# Patient Record
Sex: Female | Born: 1965 | Race: White | Hispanic: No | Marital: Married | Smoking: Never smoker
Health system: Southern US, Community
[De-identification: ages and names within clinical notes are randomized; demographics above are authoritative.]

## PROBLEM LIST (undated history)

## (undated) DIAGNOSIS — D649 Anemia, unspecified: Secondary | ICD-10-CM

## (undated) DIAGNOSIS — M1990 Unspecified osteoarthritis, unspecified site: Secondary | ICD-10-CM

## (undated) DIAGNOSIS — Z87442 Personal history of urinary calculi: Secondary | ICD-10-CM

## (undated) DIAGNOSIS — M5440 Lumbago with sciatica, unspecified side: Secondary | ICD-10-CM

## (undated) DIAGNOSIS — F419 Anxiety disorder, unspecified: Secondary | ICD-10-CM

## (undated) DIAGNOSIS — M5126 Other intervertebral disc displacement, lumbar region: Secondary | ICD-10-CM

---

## 1998-12-02 ENCOUNTER — Other Ambulatory Visit: Admission: RE | Admit: 1998-12-02 | Discharge: 1998-12-02 | Payer: Self-pay | Admitting: *Deleted

## 1999-12-03 ENCOUNTER — Other Ambulatory Visit: Admission: RE | Admit: 1999-12-03 | Discharge: 1999-12-03 | Payer: Self-pay | Admitting: *Deleted

## 2000-10-06 ENCOUNTER — Encounter: Payer: Self-pay | Admitting: *Deleted

## 2000-10-06 ENCOUNTER — Encounter: Admission: RE | Admit: 2000-10-06 | Discharge: 2000-10-06 | Payer: Self-pay | Admitting: *Deleted

## 2000-12-14 ENCOUNTER — Other Ambulatory Visit: Admission: RE | Admit: 2000-12-14 | Discharge: 2000-12-14 | Payer: Self-pay | Admitting: *Deleted

## 2002-01-03 ENCOUNTER — Other Ambulatory Visit: Admission: RE | Admit: 2002-01-03 | Discharge: 2002-01-03 | Payer: Self-pay | Admitting: *Deleted

## 2002-07-13 ENCOUNTER — Encounter: Payer: Self-pay | Admitting: Family Medicine

## 2002-07-13 ENCOUNTER — Encounter: Admission: RE | Admit: 2002-07-13 | Discharge: 2002-07-13 | Payer: Self-pay | Admitting: Family Medicine

## 2003-01-07 ENCOUNTER — Other Ambulatory Visit: Admission: RE | Admit: 2003-01-07 | Discharge: 2003-01-07 | Payer: Self-pay | Admitting: Obstetrics and Gynecology

## 2004-01-14 ENCOUNTER — Other Ambulatory Visit: Admission: RE | Admit: 2004-01-14 | Discharge: 2004-01-14 | Payer: Self-pay | Admitting: *Deleted

## 2005-04-16 ENCOUNTER — Encounter (INDEPENDENT_AMBULATORY_CARE_PROVIDER_SITE_OTHER): Payer: Self-pay | Admitting: Specialist

## 2005-04-16 ENCOUNTER — Ambulatory Visit (HOSPITAL_COMMUNITY): Admission: RE | Admit: 2005-04-16 | Discharge: 2005-04-16 | Payer: Self-pay | Admitting: Obstetrics & Gynecology

## 2007-03-23 ENCOUNTER — Encounter: Admission: RE | Admit: 2007-03-23 | Discharge: 2007-03-23 | Payer: Self-pay | Admitting: Obstetrics and Gynecology

## 2008-04-02 ENCOUNTER — Other Ambulatory Visit: Admission: RE | Admit: 2008-04-02 | Discharge: 2008-04-02 | Payer: Self-pay | Admitting: Family Medicine

## 2009-02-05 ENCOUNTER — Ambulatory Visit (HOSPITAL_COMMUNITY): Admission: RE | Admit: 2009-02-05 | Discharge: 2009-02-05 | Payer: Self-pay | Admitting: Family Medicine

## 2009-04-23 ENCOUNTER — Other Ambulatory Visit: Admission: RE | Admit: 2009-04-23 | Discharge: 2009-04-23 | Payer: Self-pay | Admitting: Family Medicine

## 2010-04-24 ENCOUNTER — Other Ambulatory Visit: Admission: RE | Admit: 2010-04-24 | Discharge: 2010-04-24 | Payer: Self-pay | Admitting: Family Medicine

## 2011-04-09 NOTE — Op Note (Signed)
Marissa Ayers, Marissa Ayers                ACCOUNT NO.:  [id number]   MEDICAL RECORD NO.:  [id number]          PATIENT TYPE:  AMB   LOCATION:  SDC                           FACILITY:  [facility]   PHYSICIAN:  Genia Del, M.D.DATE OF BIRTH:  Oct 24, 1966   DATE OF PROCEDURE:  04/16/2005  DATE OF DISCHARGE:                                 OPERATIVE REPORT   PREOPERATIVE DIAGNOSIS:  Menometrorrhagia with intrauterine lesion.   POSTOPERATIVE DIAGNOSIS:  Menometrorrhagia but no intrauterine lesion  visualized.   PROCEDURE:  Hysteroscopy with dilatation and curettage.   SURGEON:  Dr. Genia Del   ANESTHESIOLOGIST:  Dr. Tacy Dura   DESCRIPTION OF PROCEDURE:  Under MAC analgesia, the patient is in lithotomy  position.  She is prepped with Betadine on the suprapubic, vulvar, and  vaginal areas.  The bladder is catheterized, and the patient is draped as  usual.  The vaginal exam reveals an anteverted uterus, normal volume, no  adnexal mass.  The speculum is introduced in the vagina.  The anterior lip  of the cervix is grasped with a tenaculum.  Dilatation of the cervix is done  with Hagar dilators up to #33 without difficulty.  We then insert the  resectoscope in the intrauterine cavity with a double loop.  We visualize  the intrauterine cavity.  No lesion is seen.  Pictures are taken.  The ostia  are well-visualized, and pictures are taken at that level.  We remove the  instrument and proceed with a systematic curettage of the endometrial  cavity.  The specimen is sent to pathology.  Hemostasis is adequate.  We  remove all instruments.  The estimated blood loss is minimal.  The fluid  deficit is 60 mL.  No complication occurred, and the patient was brought to  recovery room in good status.       ML/MEDQ  D:  04/16/2005  T:  04/16/2005  Job:  [id number]

## 2011-06-02 ENCOUNTER — Other Ambulatory Visit (HOSPITAL_COMMUNITY)
Admission: RE | Admit: 2011-06-02 | Discharge: 2011-06-02 | Disposition: A | Discharge status: Home / Self Care | Payer: PRIVATE HEALTH INSURANCE | Source: Ambulatory Visit | Attending: Family Medicine | Admitting: Family Medicine

## 2011-06-02 ENCOUNTER — Other Ambulatory Visit: Payer: Self-pay | Admitting: Family Medicine

## 2011-06-02 DIAGNOSIS — Z124 Encounter for screening for malignant neoplasm of cervix: Secondary | ICD-10-CM | POA: Insufficient documentation

## 2012-06-15 ENCOUNTER — Other Ambulatory Visit: Payer: Self-pay | Admitting: Family Medicine

## 2012-06-15 ENCOUNTER — Other Ambulatory Visit (HOSPITAL_COMMUNITY)
Admission: RE | Admit: 2012-06-15 | Discharge: 2012-06-15 | Disposition: A | Discharge status: Home / Self Care | Payer: PRIVATE HEALTH INSURANCE | Source: Ambulatory Visit | Attending: Family Medicine | Admitting: Family Medicine

## 2012-06-15 DIAGNOSIS — Z124 Encounter for screening for malignant neoplasm of cervix: Secondary | ICD-10-CM | POA: Insufficient documentation

## 2012-11-08 ENCOUNTER — Other Ambulatory Visit (HOSPITAL_COMMUNITY): Payer: Self-pay | Admitting: Family Medicine

## 2012-11-08 DIAGNOSIS — Z1231 Encounter for screening mammogram for malignant neoplasm of breast: Secondary | ICD-10-CM

## 2012-11-08 DIAGNOSIS — Z803 Family history of malignant neoplasm of breast: Secondary | ICD-10-CM

## 2012-12-01 ENCOUNTER — Ambulatory Visit (HOSPITAL_COMMUNITY)
Admission: RE | Admit: 2012-12-01 | Discharge: 2012-12-01 | Disposition: A | Discharge status: Home / Self Care | Payer: BC Managed Care – PPO | Source: Ambulatory Visit | Attending: Family Medicine | Admitting: Family Medicine

## 2012-12-01 DIAGNOSIS — Z803 Family history of malignant neoplasm of breast: Secondary | ICD-10-CM | POA: Insufficient documentation

## 2012-12-01 DIAGNOSIS — Z1231 Encounter for screening mammogram for malignant neoplasm of breast: Secondary | ICD-10-CM | POA: Insufficient documentation

## 2013-06-20 ENCOUNTER — Other Ambulatory Visit: Payer: Self-pay | Admitting: Family Medicine

## 2013-06-20 ENCOUNTER — Other Ambulatory Visit (HOSPITAL_COMMUNITY)
Admission: RE | Admit: 2013-06-20 | Discharge: 2013-06-20 | Disposition: A | Discharge status: Home / Self Care | Payer: BC Managed Care – PPO | Source: Ambulatory Visit | Attending: Family Medicine | Admitting: Family Medicine

## 2013-06-20 DIAGNOSIS — Z124 Encounter for screening for malignant neoplasm of cervix: Secondary | ICD-10-CM | POA: Insufficient documentation

## 2015-06-10 ENCOUNTER — Other Ambulatory Visit (HOSPITAL_COMMUNITY): Payer: Self-pay | Admitting: Family Medicine

## 2015-06-10 DIAGNOSIS — Z1231 Encounter for screening mammogram for malignant neoplasm of breast: Secondary | ICD-10-CM

## 2015-06-24 ENCOUNTER — Ambulatory Visit (HOSPITAL_COMMUNITY)
Admission: RE | Admit: 2015-06-24 | Discharge: 2015-06-24 | Disposition: A | Discharge status: Home / Self Care | Payer: BLUE CROSS/BLUE SHIELD | Source: Ambulatory Visit | Attending: Family Medicine | Admitting: Family Medicine

## 2015-06-24 DIAGNOSIS — Z1231 Encounter for screening mammogram for malignant neoplasm of breast: Secondary | ICD-10-CM | POA: Insufficient documentation

## 2016-09-17 ENCOUNTER — Other Ambulatory Visit: Payer: Self-pay | Admitting: Family Medicine

## 2016-09-17 ENCOUNTER — Other Ambulatory Visit (HOSPITAL_COMMUNITY)
Admission: RE | Admit: 2016-09-17 | Discharge: 2016-09-17 | Disposition: A | Discharge status: Home / Self Care | Payer: PRIVATE HEALTH INSURANCE | Source: Ambulatory Visit | Attending: Family Medicine | Admitting: Family Medicine

## 2016-09-17 DIAGNOSIS — Z01411 Encounter for gynecological examination (general) (routine) with abnormal findings: Secondary | ICD-10-CM | POA: Diagnosis present

## 2016-09-21 LAB — CYTOLOGY - PAP: Diagnosis: NEGATIVE

## 2020-02-14 ENCOUNTER — Other Ambulatory Visit: Payer: Self-pay | Admitting: Family Medicine

## 2020-02-14 ENCOUNTER — Other Ambulatory Visit (HOSPITAL_COMMUNITY)
Admission: RE | Admit: 2020-02-14 | Discharge: 2020-02-14 | Disposition: A | Discharge status: Home / Self Care | Payer: Managed Care, Other (non HMO) | Source: Ambulatory Visit | Attending: Family Medicine | Admitting: Family Medicine

## 2020-02-14 DIAGNOSIS — Z124 Encounter for screening for malignant neoplasm of cervix: Secondary | ICD-10-CM | POA: Diagnosis not present

## 2020-02-20 LAB — CYTOLOGY - PAP: Diagnosis: NEGATIVE

## 2020-08-27 ENCOUNTER — Ambulatory Visit: Payer: PRIVATE HEALTH INSURANCE | Admitting: Dermatology

## 2020-09-03 ENCOUNTER — Ambulatory Visit: Payer: PRIVATE HEALTH INSURANCE | Admitting: Dermatology

## 2021-04-08 ENCOUNTER — Other Ambulatory Visit: Payer: Self-pay | Admitting: Sports Medicine

## 2021-04-08 DIAGNOSIS — M545 Low back pain, unspecified: Secondary | ICD-10-CM

## 2021-04-15 ENCOUNTER — Ambulatory Visit
Admission: RE | Admit: 2021-04-15 | Discharge: 2021-04-15 | Disposition: A | Discharge status: Home / Self Care | Payer: Managed Care, Other (non HMO) | Source: Ambulatory Visit | Attending: Sports Medicine | Admitting: Sports Medicine

## 2021-04-15 ENCOUNTER — Other Ambulatory Visit: Payer: Self-pay

## 2021-04-15 DIAGNOSIS — M545 Low back pain, unspecified: Secondary | ICD-10-CM

## 2021-10-09 ENCOUNTER — Emergency Department (HOSPITAL_COMMUNITY)
Admission: EM | Admit: 2021-10-09 | Discharge: 2021-10-10 | Disposition: A | Discharge status: Home / Self Care | Payer: Managed Care, Other (non HMO) | Attending: Emergency Medicine | Admitting: Emergency Medicine

## 2021-10-09 ENCOUNTER — Other Ambulatory Visit: Payer: Self-pay

## 2021-10-09 DIAGNOSIS — K5790 Diverticulosis of intestine, part unspecified, without perforation or abscess without bleeding: Secondary | ICD-10-CM

## 2021-10-09 DIAGNOSIS — K8020 Calculus of gallbladder without cholecystitis without obstruction: Secondary | ICD-10-CM | POA: Diagnosis not present

## 2021-10-09 DIAGNOSIS — B9689 Other specified bacterial agents as the cause of diseases classified elsewhere: Secondary | ICD-10-CM | POA: Diagnosis not present

## 2021-10-09 DIAGNOSIS — R110 Nausea: Secondary | ICD-10-CM | POA: Diagnosis present

## 2021-10-09 DIAGNOSIS — Z20822 Contact with and (suspected) exposure to covid-19: Secondary | ICD-10-CM | POA: Diagnosis not present

## 2021-10-09 DIAGNOSIS — K7689 Other specified diseases of liver: Secondary | ICD-10-CM | POA: Insufficient documentation

## 2021-10-09 DIAGNOSIS — Z79899 Other long term (current) drug therapy: Secondary | ICD-10-CM | POA: Diagnosis not present

## 2021-10-09 DIAGNOSIS — N390 Urinary tract infection, site not specified: Secondary | ICD-10-CM | POA: Diagnosis not present

## 2021-10-09 DIAGNOSIS — N200 Calculus of kidney: Secondary | ICD-10-CM | POA: Diagnosis not present

## 2021-10-09 DIAGNOSIS — K769 Liver disease, unspecified: Secondary | ICD-10-CM

## 2021-10-09 LAB — COMPREHENSIVE METABOLIC PANEL
ALT: 15 U/L (ref 0–44)
AST: 17 U/L (ref 15–41)
Albumin: 4.5 g/dL (ref 3.5–5.0)
Alkaline Phosphatase: 64 U/L (ref 38–126)
Anion gap: 11 (ref 5–15)
BUN: 12 mg/dL (ref 6–20)
CO2: 22 mmol/L (ref 22–32)
Calcium: 10.1 mg/dL (ref 8.9–10.3)
Chloride: 104 mmol/L (ref 98–111)
Creatinine, Ser: 0.83 mg/dL (ref 0.44–1.00)
GFR, Estimated: >60 mL/min (ref >60)
Glucose, Bld: 114 mg/dL — ABNORMAL HIGH (ref 70–99)
Potassium: 3.6 mmol/L (ref 3.5–5.1)
Sodium: 137 mmol/L (ref 135–145)
Total Bilirubin: 0.6 mg/dL (ref 0.3–1.2)
Total Protein: 7.2 g/dL (ref 6.5–8.1)

## 2021-10-09 LAB — URINALYSIS, ROUTINE W REFLEX MICROSCOPIC
Bilirubin Urine: NEGATIVE
Glucose, UA: NEGATIVE mg/dL
Ketones, ur: 80 mg/dL — AB
Nitrite: NEGATIVE
Protein, ur: 100 mg/dL — AB
RBC / HPF: >50 RBC/hpf — ABNORMAL HIGH (ref 0–5)
Specific Gravity, Urine: 1.024 (ref 1.005–1.030)
WBC, UA: >50 WBC/hpf — ABNORMAL HIGH (ref 0–5)
pH: 5 (ref 5.0–8.0)

## 2021-10-09 LAB — CBC WITH DIFFERENTIAL/PLATELET
Abs Immature Granulocytes: 0.03 10*3/uL (ref 0.00–0.07)
Basophils Absolute: 0 10*3/uL (ref 0.0–0.1)
Basophils Relative: 1 %
Eosinophils Absolute: 0 10*3/uL (ref 0.0–0.5)
Eosinophils Relative: 0 %
HCT: 42.5 % (ref 36.0–46.0)
Hemoglobin: 14 g/dL (ref 12.0–15.0)
Immature Granulocytes: 0 %
Lymphocytes Relative: 25 %
Lymphs Abs: 1.9 10*3/uL (ref 0.7–4.0)
MCH: 27.2 pg (ref 26.0–34.0)
MCHC: 32.9 g/dL (ref 30.0–36.0)
MCV: 82.7 fL (ref 80.0–100.0)
Monocytes Absolute: 0.4 10*3/uL (ref 0.1–1.0)
Monocytes Relative: 5 %
Neutro Abs: 5.1 10*3/uL (ref 1.7–7.7)
Neutrophils Relative %: 69 %
Platelets: 291 10*3/uL (ref 150–400)
RBC: 5.14 MIL/uL — ABNORMAL HIGH (ref 3.87–5.11)
RDW: 13.8 % (ref 11.5–15.5)
WBC: 7.5 10*3/uL (ref 4.0–10.5)
nRBC: 0 % (ref 0.0–0.2)

## 2021-10-09 LAB — RAPID URINE DRUG SCREEN, HOSP PERFORMED
Amphetamines: NOT DETECTED
Barbiturates: NOT DETECTED
Benzodiazepines: POSITIVE — AB
Cocaine: NOT DETECTED
Opiates: NOT DETECTED
Tetrahydrocannabinol: NOT DETECTED

## 2021-10-09 LAB — RESP PANEL BY RT-PCR (FLU A&B, COVID) ARPGX2
Influenza A by PCR: NEGATIVE
Influenza B by PCR: NEGATIVE
SARS Coronavirus 2 by RT PCR: NEGATIVE

## 2021-10-09 LAB — LIPASE, BLOOD: Lipase: 53 U/L — ABNORMAL HIGH (ref 11–51)

## 2021-10-09 LAB — D-DIMER, QUANTITATIVE: D-Dimer, Quant: <0.27 ug/mL-FEU (ref 0.00–0.50)

## 2021-10-09 MED ORDER — ACETAMINOPHEN 500 MG PO TABS
1000.0000 mg | ORAL_TABLET | Freq: Once | ORAL | Status: AC
  Administered 2021-10-09: 1000 mg via ORAL
  Filled 2021-10-09: qty 2

## 2021-10-09 NOTE — ED Provider Notes (Signed)
Emergency Medicine Provider Triage Evaluation Note  Marissa Ayers , a 55 y.o. female  was evaluated in triage.  Pt complains of shortness of breath, generalized weakness, nausea, and lower back pain.  Patient reports that she has had general weakness and nausea over the last 2 weeks.  Due to her generalized weakness she has not been able to ambulate as well as usual which has caused her to have worsening of her herniated disc.  Patient denies any IV drug use.  Patient states that she ran out of her Xanax medication.  Review of Systems  Positive: Shortness of breath, generalized weakness, nausea, lower back pain, chills Negative: Fever, abdominal pain, dysuria, hematuria, urinary urgency, constipation, diarrhea, cough, leg swelling or tenderness, chest pain  Physical Exam  BP (!) 136/96 (BP Location: Right Arm)   Pulse (!) 108   Temp 97.8 F (36.6 C)   Resp 17   SpO2 100%  Gen:   Awake, no distress   Resp:  Normal effort, lungs clear to auscultation bilaterally MSK:   Moves extremities without difficulty  Other:  Abdomen soft, nondistended, nontender.  Patient able to stand and ambulate without difficulty.  Medical Decision Making  Medically screening exam initiated at 4:27 PM.  Appropriate orders placed.  Kermit Balo was informed that the remainder of the evaluation will be completed by another provider, this initial triage assessment does not replace that evaluation, and the importance of remaining in the ED until their evaluation is complete.     Haskel Schroeder, PA-C 10/09/21 1629    Bethann Berkshire, MD 10/09/21 2211

## 2021-10-09 NOTE — ED Triage Notes (Signed)
Pt was started on lexapro 2 weeks ago and only took for 4 days d/t side effects.  Pt states she has had nausea since then and has not moved around much resulting in lower back pain and now today feels short of breath.  Pt states she does take xanax at night for her anxiety but is currently out.

## 2021-10-10 ENCOUNTER — Emergency Department (HOSPITAL_COMMUNITY): Payer: Managed Care, Other (non HMO)

## 2021-10-10 ENCOUNTER — Encounter (HOSPITAL_COMMUNITY): Payer: Self-pay | Admitting: Student

## 2021-10-10 LAB — TSH: TSH: 2.426 u[IU]/mL (ref 0.350–4.500)

## 2021-10-10 MED ORDER — ONDANSETRON 4 MG PO TBDP: 4.0000 mg | ORAL_TABLET | Freq: Three times a day (TID) | ORAL | 0 refills | Status: DC | PRN

## 2021-10-10 MED ORDER — SODIUM CHLORIDE 0.9 % IV SOLN
1.0000 g | Freq: Once | INTRAVENOUS | Status: AC
  Administered 2021-10-10: 1 g via INTRAVENOUS
  Filled 2021-10-10: qty 10

## 2021-10-10 MED ORDER — LIDOCAINE 5 % EX PTCH
1.0000 | MEDICATED_PATCH | CUTANEOUS | Status: DC
  Filled 2021-10-10: qty 1

## 2021-10-10 MED ORDER — CEFPODOXIME PROXETIL 200 MG PO TABS: 200.0000 mg | ORAL_TABLET | Freq: Two times a day (BID) | ORAL | 0 refills | Status: DC

## 2021-10-10 MED ORDER — ONDANSETRON HCL 4 MG/2ML IJ SOLN
4.0000 mg | Freq: Once | INTRAMUSCULAR | Status: AC
  Administered 2021-10-10: 4 mg via INTRAVENOUS
  Filled 2021-10-10: qty 2

## 2021-10-10 MED ORDER — FENTANYL CITRATE PF 50 MCG/ML IJ SOSY
50.0000 ug | PREFILLED_SYRINGE | Freq: Once | INTRAMUSCULAR | Status: AC
Start: 2021-10-10 — End: 2021-10-10
  Administered 2021-10-10: 50 ug via INTRAVENOUS
  Filled 2021-10-10: qty 1

## 2021-10-10 MED ORDER — SODIUM CHLORIDE 0.9 % IV BOLUS
2000.0000 mL | Freq: Once | INTRAVENOUS | Status: AC
  Administered 2021-10-10: 2000 mL via INTRAVENOUS

## 2021-10-10 MED ORDER — FAMOTIDINE IN NACL 20-0.9 MG/50ML-% IV SOLN
20.0000 mg | Freq: Once | INTRAVENOUS | Status: AC
  Administered 2021-10-10: 20 mg via INTRAVENOUS
  Filled 2021-10-10: qty 50

## 2021-10-10 MED ORDER — MELOXICAM 15 MG PO TABS: 15.0000 mg | ORAL_TABLET | Freq: Every day | ORAL | 0 refills | Status: DC | PRN

## 2021-10-10 MED ORDER — LORAZEPAM 2 MG/ML IJ SOLN
1.0000 mg | Freq: Once | INTRAMUSCULAR | Status: AC
  Administered 2021-10-10: 1 mg via INTRAVENOUS
  Filled 2021-10-10: qty 1

## 2021-10-10 MED ORDER — KETOROLAC TROMETHAMINE 15 MG/ML IJ SOLN
15.0000 mg | Freq: Once | INTRAMUSCULAR | Status: AC
  Administered 2021-10-10: 15 mg via INTRAVENOUS
  Filled 2021-10-10: qty 1

## 2021-10-10 NOTE — Discharge Instructions (Addendum)
You were seen in the ER today for nausea and weakness.  Your labs showed findings of a UTI, we are treating this with antibiotics- please take these as prescribed.  We are also sending you home with nausea & pain medication. Meloxicam- this is a nonsteroidal anti-inflammatory medication that will help with pain and swelling. Be sure to take this medication as prescribed with food, 1 pill every 24 hours,  It should be taken with food, as it can cause stomach upset, and more seriously, stomach bleeding. Do not take other nonsteroidal anti-inflammatory medications with this such as Advil, Motrin, Aleve, Mobic, Goodie Powder, or Motrin etc..    You make take Tylenol per over the counter dosing with these medications.   We have prescribed you new medication(s) today. Discuss the medications prescribed today with your pharmacist as they can have adverse effects and interactions with your other medicines including over the counter and prescribed medications. Seek medical evaluation if you start to experience new or abnormal symptoms after taking one of these medicines, seek care immediately if you start to experience difficulty breathing, feeling of your throat closing, facial swelling, or rash as these could be indications of a more serious allergic reaction.  Your CT scan showed the following findings:  - liver lesion- you need an MRI to further evaluate- please discuss this with your primary care provider.  -Gallstones - Kidney stones with a large stone on the right.  - Diverticulosis.   Please follow-up with urology to discuss your kidney stones.  You can discuss the rest of these findings with your primary care provider.  Please follow-up as soon as possible.  Return to the emergency department for new or worsening symptoms including but not limited to new or worsening pain, fever, inability to keep fluids down, blood in vomit or stool, passing out, or any other concerns.

## 2021-10-10 NOTE — ED Notes (Signed)
Pt to bathroom

## 2021-10-10 NOTE — ED Provider Notes (Signed)
[facility] EMERGENCY DEPARTMENT Provider Note   CSN: [id number] Arrival date & time: 10/09/21  1526     History Chief Complaint  Patient presents with   Nausea   Weakness    Marissa Ayers is a 55 y.o. female without significant past medical history who presents to the emergency department with complaints of nausea and generalized weakness over the past 2 weeks.  Patient states that she was going through a stressful time with increased anxiety therefore her PCP placed her on Lexapro.  She took this for about 4 days with significant nausea, decreased appetite, increased anxiety, and onset of depression.  Depression has mostly resolved, however her anxiety, nausea, and poor appetite has persisted. She is not eating or drinking much at all, she feels very weak and does not want to get out of bed. Staying in bed has lead to her having right lower back pain that radiates around to the side which she think is from her prior herniated disc. She reports that she does have some dyspnea when her anxiety elevates. She denies fever, vomiting, diarrhea, abdominal pain, dysuria, frequency, urgency, syncope, suicidal ideation, homicidal ideation, or hallucinations.  She currently takes Aleve as needed for her back pain as well as Xanax as needed at night for sleep.  HPI     History reviewed. No pertinent past medical history.  There are no problems to display for this patient.   History reviewed. No pertinent surgical history.   OB History   No obstetric history on file.     History reviewed. No pertinent family history.     Home Medications Prior to Admission medications   Not on File    Allergies    Bactrim [sulfamethoxazole-trimethoprim], Cymbalta [duloxetine hcl], and Lexapro [escitalopram]  Review of Systems   Review of Systems  Constitutional:  Positive for fatigue. Negative for fever.  Respiratory:  Positive for shortness of breath. Negative for cough.    Cardiovascular:  Negative for chest pain and leg swelling.  Gastrointestinal:  Positive for nausea. Negative for abdominal pain, diarrhea and vomiting.  Genitourinary:  Negative for dysuria, frequency and urgency.  Musculoskeletal:  Positive for back pain.  Neurological:  Positive for weakness. Negative for syncope and numbness.       Negative for incontinence or saddle anesthesia.  Psychiatric/Behavioral:  Negative for suicidal ideas. The patient is nervous/anxious.   All other systems reviewed and are negative.  Physical Exam Updated Vital Signs BP (!) 151/86   Pulse 78   Temp 97.9 F (36.6 C)   Resp 20   Ht 5\' 4"  (1.626 m)   Wt 51.7 kg   SpO2 100%   BMI 19.57 kg/m   Physical Exam Vitals and nursing note reviewed.  Constitutional:      General: She is not in acute distress.    Appearance: She is well-developed. She is not toxic-appearing.  HENT:     Head: Normocephalic and atraumatic.     Mouth/Throat:     Mouth: Mucous membranes are dry.  Eyes:     General:        Right eye: No discharge.        Left eye: No discharge.     Extraocular Movements: Extraocular movements intact.     Conjunctiva/sclera: Conjunctivae normal.     Pupils: Pupils are equal, round, and reactive to light.  Cardiovascular:     Rate and Rhythm: Normal rate and regular rhythm.  Pulmonary:     Effort:  Pulmonary effort is normal. No respiratory distress.     Breath sounds: Normal breath sounds. No wheezing, rhonchi or rales.  Abdominal:     General: There is no distension.     Palpations: Abdomen is soft.     Tenderness: There is no abdominal tenderness. There is no guarding or rebound.  Musculoskeletal:     Cervical back: Neck supple.     Right lower leg: No tenderness. No edema.     Left lower leg: No tenderness. No edema.     Comments: No midline tenderness or palpable step off.   Skin:    General: Skin is warm and dry.     Findings: No rash.  Neurological:     Mental Status: She is  alert.     Comments: Clear speech.  Sensation grossly intact bilateral upper and lower extremities.  5/5 symmetric grip strength and strength with plantar and dorsiflexion bilaterally.  Normal finger-nose.  Psychiatric:        Mood and Affect: Mood is anxious. Affect is flat.    ED Results / Procedures / Treatments   Labs (all labs ordered are listed, but only abnormal results are displayed) Labs Reviewed  COMPREHENSIVE METABOLIC PANEL - Abnormal; Notable for the following components:      Result Value   Glucose, Bld 114 (*)    All other components within normal limits  CBC WITH DIFFERENTIAL/PLATELET - Abnormal; Notable for the following components:   RBC 5.14 (*)    All other components within normal limits  URINALYSIS, ROUTINE W REFLEX MICROSCOPIC - Abnormal; Notable for the following components:   APPearance CLOUDY (*)    Hgb urine dipstick MODERATE (*)    Ketones, ur 80 (*)    Protein, ur 100 (*)    Leukocytes,Ua LARGE (*)    RBC / HPF >50 (*)    WBC, UA >50 (*)    Bacteria, UA RARE (*)    Non Squamous Epithelial 0-5 (*)    All other components within normal limits  RAPID URINE DRUG SCREEN, HOSP PERFORMED - Abnormal; Notable for the following components:   Benzodiazepines POSITIVE (*)    All other components within normal limits  LIPASE, BLOOD - Abnormal; Notable for the following components:   Lipase 53 (*)    All other components within normal limits  RESP PANEL BY RT-PCR (FLU A&B, COVID) ARPGX2  URINE CULTURE  D-DIMER, QUANTITATIVE  TSH    EKG EKG Interpretation  Date/Time:  Friday October 09 2021 15:36:34 EST Ventricular Rate:  109 PR Interval:  160 QRS Duration: 80 QT Interval:  306 QTC Calculation: 412 R Axis:   85 Text Interpretation: Sinus tachycardia Otherwise normal ECG No old tracing to compare Confirmed by Meridee Score [id number]) on 10/09/2021 3:44:15 PM  Radiology DG Chest 2 View  Result Date: 10/10/2021 CLINICAL DATA:  Dyspnea EXAM: CHEST - 2  VIEW COMPARISON:  None. FINDINGS: The heart size and mediastinal contours are within normal limits. Both lungs are clear. The visualized skeletal structures are unremarkable. IMPRESSION: No active cardiopulmonary disease. Electronically Signed   By: Alcide Clever M.D.   On: 10/10/2021 03:45   CT Renal Stone Study  Result Date: 10/10/2021 CLINICAL DATA:  Flank pain on the right, initial encounter EXAM: CT ABDOMEN AND PELVIS WITHOUT CONTRAST TECHNIQUE: Multidetector CT imaging of the abdomen and pelvis was performed following the standard protocol without IV contrast. COMPARISON:  None. FINDINGS: Lower chest: No acute abnormality. There is a 3.8 x 3.4 cm  hypodense mass lesion within the right lobe of the liver with central further decreased attenuation likely representing necrosis. The gallbladder is decompressed with multiple gallstones within. No biliary ductal dilatation is noted. Hepatobiliary: No focal liver abnormality is seen. No gallstones, gallbladder wall thickening, or biliary dilatation. Pancreas: Unremarkable. No pancreatic ductal dilatation or surrounding inflammatory changes. Spleen: Spleen is well visualized and within normal limits. Adrenals/Urinary Tract: Adrenal glands are within normal limits. Kidneys are well visualized bilaterally. Tiny nonobstructing stones are seen bilaterally. Additionally a dominant 13 x 16 mm right renal pelvic stone is seen. No obstructive changes are noted. Ureters are within normal limits. Bladder is partially distended. Stomach/Bowel: Mild diverticular change of the colon is noted. The appendix is well visualized and within normal limits. Small bowel and stomach appear unremarkable. Vascular/Lymphatic: No significant vascular findings are present. No enlarged abdominal or pelvic lymph nodes. Reproductive: Uterus and bilateral adnexa are unremarkable. Other: No abdominal wall hernia or abnormality. No abdominopelvic ascites. Musculoskeletal: No acute or significant  osseous findings. IMPRESSION: Hypodense mass lesion within the right lobe of the liver. The central decreased attenuation suggests necrosis. Further workup is recommended. Nonemergent MRI is recommended for evaluation. Cholelithiasis without complicating factors. Bilateral nonobstructing renal calculi. A large right renal pelvic stone is noted as described. Mild diverticular change without diverticulitis. Electronically Signed   By: Inez Catalina M.D.   On: 10/10/2021 03:56    Procedures Procedures   Medications Ordered in ED Medications  lidocaine (LIDODERM) 5 % 1 patch (1 patch Transdermal Patient Refused/Not Given 10/10/21 0324)  cefTRIAXone (ROCEPHIN) 1 g in sodium chloride 0.9 % 100 mL IVPB (has no administration in time range)  ketorolac (TORADOL) 15 MG/ML injection 15 mg (has no administration in time range)  acetaminophen (TYLENOL) tablet 1,000 mg (1,000 mg Oral Given 10/09/21 1632)  sodium chloride 0.9 % bolus 2,000 mL (2,000 mLs Intravenous New Bag/Given 10/10/21 0321)  ondansetron (ZOFRAN) injection 4 mg (4 mg Intravenous Given 10/10/21 0322)  fentaNYL (SUBLIMAZE) injection 50 mcg (50 mcg Intravenous Given 10/10/21 0324)    ED Course  I have reviewed the triage vital signs and the nursing notes.  Pertinent labs & imaging results that were available during my care of the patient were reviewed by me and considered in my medical decision making (see chart for details).    MDM Rules/Calculators/A&P                           Patient presents to the ED with complaints of anxiety, nausea, decreased PO intake, and back pain. Nontoxic, initial tachycardia normalized on my evaluation.   Additional history obtained:  Additional history obtained from chart review & nursing note review.   Lab Tests:  I reviewed and interpreted labs, which included:  CBC: CMP: Unremarkable Lipase: Minimally elevated Urinalysis: Hematuria and large leukocytes present, rare bacteria D-dimer:  WNL COVID/Flu: Negative UDS: positive for benzos- consistent w/ patient's reported xanax.   Ordered TSH: WNL  Imaging Studies ordered:  I ordered imaging studies which included CXR & CT renal stone study, I independently reviewed, formal radiology impression shows:  CXR: No active cardiopulmonary disease.  CT Renal Stone study: Hypodense mass lesion within the right lobe of the liver. The central decreased attenuation suggests necrosis. Further workup is recommended. Nonemergent MRI is recommended for evaluation. Cholelithiasis without complicating factors. Bilateral nonobstructing renal calculi. A large right renal pelvic stone is noted as described. Mild diverticular change without diverticulitis  ED Course:  Patient without urinary sxs but is having some back pain with nausea and poor appetite with concerning UA- discussed w/ attending will tx with abx. She does have a notable right sided renal pelvic stone, however no obstructive changes, renal function preserved- will provide urology follow up. She does not appear toxic or septic. Additional CT findings as above. No significant electrolyte derangement, thyroid dysfunction, AKI or LFT abnormalities. D-dimer WNL and low risk wells- doubt PE. Overall reassuring ED work up- patient feeling improved, she is tolerating PO and ambulatory, seems reasonable for discharge with abx and outpatient follow up. I discussed results, treatment plan, need for follow-up, and return precautions with the patient and her husband. Provided opportunity for questions, patient & her husband confirmed understanding and are in agreement with plan.   Findings and plan of care discussed with supervising physician Dr. Betsey Holiday who is in agreement.   Portions of this note were generated with [employer]. Dictation errors may occur despite best attempts at proofreading.  Final Clinical Impression(s) / ED Diagnoses Final diagnoses:  Acute UTI  Liver lesion   Kidney stones  Calculus of gallbladder without cholecystitis without obstruction  Diverticulosis    Rx / DC Orders ED Discharge Orders          Ordered    cefpodoxime (VANTIN) 200 MG tablet  2 times daily        10/10/21 0636    ondansetron (ZOFRAN ODT) 4 MG disintegrating tablet  Every 8 hours PRN        10/10/21 0636    meloxicam (MOBIC) 15 MG tablet  Daily PRN        10/10/21 0636             Amaryllis Dyke, PA-C 10/10/21 0711    Orpah Greek, MD 10/11/21 [phone]

## 2021-10-10 NOTE — ED Notes (Signed)
Patient transported to X-ray 

## 2021-10-11 LAB — URINE CULTURE

## 2021-10-12 ENCOUNTER — Emergency Department
Admission: EM | Admit: 2021-10-12 | Discharge: 2021-10-12 | Disposition: A | Discharge status: Home / Self Care | Payer: Managed Care, Other (non HMO) | Attending: Emergency Medicine | Admitting: Emergency Medicine

## 2021-10-12 ENCOUNTER — Encounter: Payer: Self-pay | Admitting: Emergency Medicine

## 2021-10-12 ENCOUNTER — Other Ambulatory Visit: Payer: Self-pay

## 2021-10-12 DIAGNOSIS — M5441 Lumbago with sciatica, right side: Secondary | ICD-10-CM | POA: Diagnosis present

## 2021-10-12 DIAGNOSIS — M79604 Pain in right leg: Secondary | ICD-10-CM | POA: Insufficient documentation

## 2021-10-12 HISTORY — DX: Anxiety disorder, unspecified: F41.9

## 2021-10-12 HISTORY — DX: Lumbago with sciatica, unspecified side: M54.40

## 2021-10-12 HISTORY — DX: Other intervertebral disc displacement, lumbar region: M51.26

## 2021-10-12 MED ORDER — CEFADROXIL 500 MG PO CAPS
1000.0000 mg | ORAL_CAPSULE | ORAL | Status: AC
  Administered 2021-10-12: 1000 mg via ORAL
  Filled 2021-10-12: qty 2

## 2021-10-12 MED ORDER — OXYCODONE-ACETAMINOPHEN 5-325 MG PO TABS
2.0000 | ORAL_TABLET | Freq: Once | ORAL | Status: AC
  Administered 2021-10-12: 2 via ORAL
  Filled 2021-10-12: qty 2

## 2021-10-12 MED ORDER — LIDOCAINE 5 % EX PTCH: 1.0000 | MEDICATED_PATCH | Freq: Two times a day (BID) | CUTANEOUS | 0 refills | Status: AC

## 2021-10-12 MED ORDER — PREDNISONE 10 MG PO TABS: ORAL_TABLET | ORAL | 0 refills | Status: DC

## 2021-10-12 MED ORDER — DICLOFENAC SODIUM 1.6 % EX GEL
1.0000 | Freq: Four times a day (QID) | CUTANEOUS | 0 refills | Status: DC | PRN
Start: 2021-10-12 — End: 2023-07-19

## 2021-10-12 MED ORDER — PREDNISONE 20 MG PO TABS
60.0000 mg | ORAL_TABLET | ORAL | Status: AC
  Administered 2021-10-12: 60 mg via ORAL
  Filled 2021-10-12: qty 6

## 2021-10-12 MED ORDER — LIDOCAINE 5 % EX PTCH
1.0000 | MEDICATED_PATCH | CUTANEOUS | Status: DC
  Administered 2021-10-12: 1 via TRANSDERMAL
  Filled 2021-10-12: qty 1

## 2021-10-12 MED ORDER — CYCLOBENZAPRINE HCL 5 MG PO TABS: 5.0000 mg | ORAL_TABLET | Freq: Three times a day (TID) | ORAL | 0 refills | Status: DC | PRN

## 2021-10-12 NOTE — ED Provider Notes (Signed)
[facility] Emergency Department Provider Note  ____________________________________________   Event Date/Time   First MD Initiated Contact with Patient 10/12/21 [phone]     (approximate)  I have reviewed the triage vital signs and the nursing notes.   HISTORY  Chief Complaint Back Pain    HPI DEBBORAH Ayers is a 55 y.o. female with medical history as listed below who presents for evaluation of about a week of lower back pain that is mostly on the right side of her low back and radiates down the right leg.  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  She said that she has been ill recently and bedbound for 2 weeks which aggravated her back pain and is causing the severe and constant pain that she is experiencing currently.  Moving around and raising her leg and ambulating make it worse, nothing in particular make it better.  She was seen at [facility] about 2 nights ago for this issue as well as general malaise.  She was diagnosed with a urinary tract infection and treated with ceftriaxone.  She has not yet picked up her prescription for antibiotics.  She said that she has been taking meloxicam for the back pain but that is not helping.  She denies numbness, tingling, urinary incontinence, urinary retention, bowel incontinence, or focal weakness.  She denies recent fever, chest pain, shortness of breath.     Past Medical History:  Diagnosis Date   Anxiety    Low back pain with sciatica    Lumbar herniated disc    L5-S1    There are no problems to display for this patient.   History reviewed. No pertinent surgical history.  Prior to Admission medications   Medication Sig Start Date End Date Taking? Authorizing Provider  cyclobenzaprine (FLEXERIL) 5 MG tablet Take 1 tablet (5 mg total) by mouth 3 (three) times daily as needed for muscle spasms. 10/12/21  Yes Loleta Rose, MD   Diclofenac Sodium 1.6 % GEL Apply 1 application topically every 6 (six) hours as needed (pain). 10/12/21  Yes Loleta Rose, MD  lidocaine (LIDODERM) 5 % Place 1 patch onto the skin every 12 (twelve) hours. Remove & Discard patch within 12 hours or as directed by MD.  Wynelle Fanny the patch off for 12 hours before applying a new one. 10/12/21 10/12/22 Yes Loleta Rose, MD  predniSONE (DELTASONE) 10 MG tablet Take 6 tabs (60 mg) PO x 3 days, then take 4 tabs (40 mg) PO x 3 days, then take 2 tabs (20 mg) PO x 3 days, then take 1 tab (10 mg) PO x 3 days, then take 1/2 tab (5 mg) PO x 4 days. 10/12/21  Yes Loleta Rose, MD  cefpodoxime (VANTIN) 200 MG tablet Take 1 tablet (200 mg total) by mouth 2 (two) times daily. 10/10/21   Petrucelli, Pleas Koch, PA-C  meloxicam (MOBIC) 15 MG tablet Take 1 tablet (15 mg total) by mouth daily as needed for pain. 10/10/21   Petrucelli, Samantha R, PA-C  ondansetron (ZOFRAN ODT) 4 MG disintegrating tablet Take 1 tablet (4 mg total) by mouth every 8 (eight) hours as needed for nausea or vomiting. 10/10/21   Petrucelli, Samantha R, PA-C    Allergies Bactrim [sulfamethoxazole-trimethoprim], Cymbalta [duloxetine hcl], and Lexapro [escitalopram]  History reviewed. No pertinent family history.  Social History Social History   Tobacco Use   Smoking status: Never   Smokeless  tobacco: Never    Review of Systems Constitutional: No fever/chills Eyes: No visual changes. ENT: No sore throat. Cardiovascular: Denies chest pain. Respiratory: Denies shortness of breath. Gastrointestinal: No abdominal pain.  No nausea, no vomiting.  No diarrhea.  No constipation.  Negative for bowel incontinence. Genitourinary: Positive for dysuria.  Negative for urinary retention and urinary incontinence. Musculoskeletal: Positive for low back pain on the right side and radiating down the right leg. Integumentary: Negative for rash. Neurological: Negative for headaches, focal weakness or  numbness.   ____________________________________________   PHYSICAL EXAM:  VITAL SIGNS: ED Triage Vitals  Enc Vitals Group     BP 10/12/21 0445 (!) 138/96     Pulse Rate 10/12/21 0445 (!) 110     Resp 10/12/21 0445 18     Temp 10/12/21 0445 97.7 F (36.5 C)     Temp Source 10/12/21 0445 Oral     SpO2 10/12/21 0445 100 %     Weight --      Height --      Head Circumference --      Peak Flow --      Pain Score 10/12/21 0442 10     Pain Loc --      Pain Edu? --      Excl. in GC? --     Constitutional: Alert and oriented.  Appears uncomfortable. Eyes: Conjunctivae are normal.  Head: Atraumatic. Nose: No congestion/rhinnorhea. Mouth/Throat: Patient is wearing a mask. Neck: No stridor.  No meningeal signs.   Cardiovascular: Normal rate, regular rhythm. Good peripheral circulation. Respiratory: Normal respiratory effort.  No retractions. Gastrointestinal: Soft and nontender. No distention.  Musculoskeletal: Pain with straight leg raise of the right leg and with ambulation.  Tenderness to palpation of the paraspinal muscles to the right of midline in her lower back consistent with her history of herniated disc.  No tenderness to palpation along the lumbar spine itself.  No palpable areas of induration, fluctuance, or edema, and there is no erythema or other evidence of infection on visual exam. Neurologic:  Normal speech and language. No gross focal neurologic deficits are appreciated.  Range of motion particular in the right leg is somewhat limited by pain. Skin:  Skin is warm, dry and intact.  ____________________________________________    INITIAL IMPRESSION / MDM / ASSESSMENT AND PLAN / ED COURSE  As part of my medical decision making, I reviewed the following data within the electronic MEDICAL RECORD NUMBER Nursing notes reviewed and incorporated, Old chart reviewed, Notes from prior ED visits, and [place] Controlled Substance Database   Differential diagnosis includes, but is not  limited to, herniated disc, lumbar radiculopathy, sciatica, acute on chronic back pain, UTI/pyelonephritis, osteomyelitis, transverse myelitis, discitis, epidural abscess, cauda equina.  I reviewed the emergency department visit from [facility] which occurred within the last couple of days.  She had an extensive evaluation including a CT scan which was generally reassuring.  She was treated with ceftriaxone and prescribed antibiotics.  She has not yet picked them up but says she plans to do so this morning.  Based on the treatment with ceftriaxone and the fact that her main issue tonight is easily reproducible tenderness to palpation of the right lumbar spine which radiates down her leg and causes symptoms consistent with sciatica, which she has had in the past, I strongly doubt that she is suffering from pyelonephritis, and obstructed stone which would have been seen on CT, or other potential emergent conditions.  She has no  warning signs or symptoms associated with lower back pain which would cause concern for osteomyelitis, transverse myelitis, discitis, epidural abscess, cauda equina, etc.  I treated the patient tonight with Percocet x2 and a Lidoderm patch as well as a first dose of prednisone.  Also gave her a dose of cefadroxil 1000 mg because she has not yet picked up her antibiotic prescription from the pharmacy and I am concerned that she may not do so.  Prescriptions as listed below to treat sciatica without narcotic pain medication which is generally the recommendation.  I provided follow-up information with Dr. Myer Haff in the area to see neurosurgery clinic.  She understands and agrees with these plans and I gave her my usual and customary back pain return precautions.           ____________________________________________  FINAL CLINICAL IMPRESSION(S) / ED DIAGNOSES  Final diagnoses:  Right-sided low back pain with right-sided sciatica, unspecified chronicity     MEDICATIONS  GIVEN DURING THIS VISIT:  Medications  lidocaine (LIDODERM) 5 % 1 patch (1 patch Transdermal Patch Applied 10/12/21 0535)  oxyCODONE-acetaminophen (PERCOCET/ROXICET) 5-325 MG per tablet 2 tablet (2 tablets Oral Given 10/12/21 0534)  predniSONE (DELTASONE) tablet 60 mg (60 mg Oral Given 10/12/21 0534)  cefadroxil (DURICEF) capsule 1,000 mg (1,000 mg Oral Given 10/12/21 0534)     ED Discharge Orders          Ordered    predniSONE (DELTASONE) 10 MG tablet        10/12/21 0532    lidocaine (LIDODERM) 5 %  Every 12 hours        10/12/21 0532    cyclobenzaprine (FLEXERIL) 5 MG tablet  3 times daily PRN        10/12/21 0532    Diclofenac Sodium 1.6 % GEL  Every 6 hours PRN        10/12/21 0532             Note:  This document was prepared using Dragon voice recognition software and may include unintentional dictation errors.   Loleta Rose, MD 10/12/21 [phone]

## 2021-10-12 NOTE — ED Triage Notes (Signed)
Pt c/o "excruciating pain for 5 hrs". Pt states hx of herniated disc to lower back. Pt ambulatory with steady gait to triage at this time. Pt seen at [facility] for same on Friday.

## 2021-10-12 NOTE — Discharge Instructions (Addendum)
You were evaluated in the Emergency Department today for back pain. Your evaluation suggests no acute abnormalities which require further intervention at this time.   - Move around as tolerated but avoiding heavy lifting. "Bed rest" is not recommended nor is it the best treatment for low back pain.  - Medications will help control your discomfort: -- acetaminophen (Tylenol) 1000 mg every 6 hours -- Any other prescriptions you were provided as per label instructions  -- Do not drink alcohol, drive a car, operate machinery, or get up on ladders or heights when taking any prescribed pain medications or muscle relaxers.  -- Do not drive home if you received prescribed pain medications here in the ED.  Please follow up with your primary care physician as needed or any other providers listed in this paperwork. If you do not have a primary doctor, you can call your insurance company to find one.  If you do not have insurance, you can go to the finance/registration department for more assistance.  Return to the ED immediately if you develop any of the following problems: -- Leaking urine or difficulty urinating; -- Inability to control your bowels; -- New numbness or weakness in your legs or numbness between your legs; -- Inability to walk -- Fever

## 2021-10-12 NOTE — ED Notes (Signed)
Medications administered by this RN, pt requesting to know if she is able to get the percocet in a "shot form" due to difficulty "swallowing the big round pills", does not state difficulty swallowing abx capsules or other pills in cup. Pt able to swallow medications, encouraged to take PO pain medication as written by EDP. Pt able to take pain medication, states feels like it is stuck in her throat initially but it able to continue to swallow water then states feels like it has passed.   D/C papers reviewed by this RN at this time. Pt ambulatory without difficulty to lobby to be D/C into the care of her son.

## 2021-11-09 ENCOUNTER — Encounter (HOSPITAL_COMMUNITY): Payer: Self-pay | Admitting: Radiology

## 2022-12-07 ENCOUNTER — Other Ambulatory Visit: Payer: Self-pay | Admitting: Nurse Practitioner

## 2022-12-07 ENCOUNTER — Ambulatory Visit
Admission: RE | Admit: 2022-12-07 | Discharge: 2022-12-07 | Disposition: A | Discharge status: Home / Self Care | Payer: Managed Care, Other (non HMO) | Source: Ambulatory Visit | Attending: Nurse Practitioner | Admitting: Nurse Practitioner

## 2022-12-07 DIAGNOSIS — M533 Sacrococcygeal disorders, not elsewhere classified: Secondary | ICD-10-CM

## 2022-12-07 DIAGNOSIS — M47816 Spondylosis without myelopathy or radiculopathy, lumbar region: Secondary | ICD-10-CM

## 2023-04-09 IMAGING — CT CT RENAL STONE PROTOCOL
2 of 4 series · 16 of 46 positions shown, 18 images · non-contrast
Comparison: None.

CLINICAL DATA: Flank pain on the right, initial encounter

EXAM:
CT ABDOMEN AND PELVIS WITHOUT CONTRAST
TECHNIQUE: Multidetector CT imaging of the abdomen and pelvis was performed
following the standard protocol without IV contrast.

[Series 3: ap without · axial · non-contrast · 0.80mm/px · z∈[+835,+1220]mm · 13 of 87 slices shown, 15 images]
[im 5/87  soft-tissue]
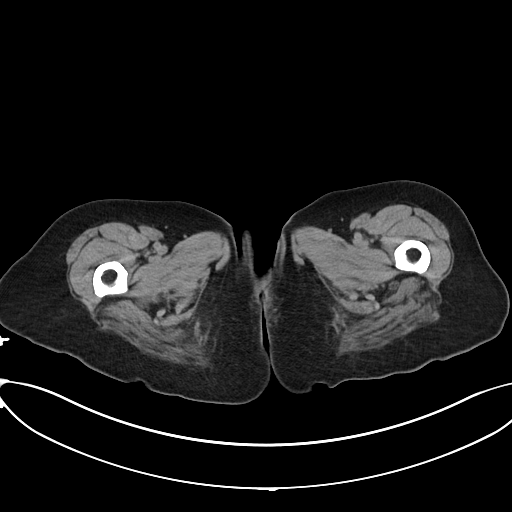
[im 5/87  bone]
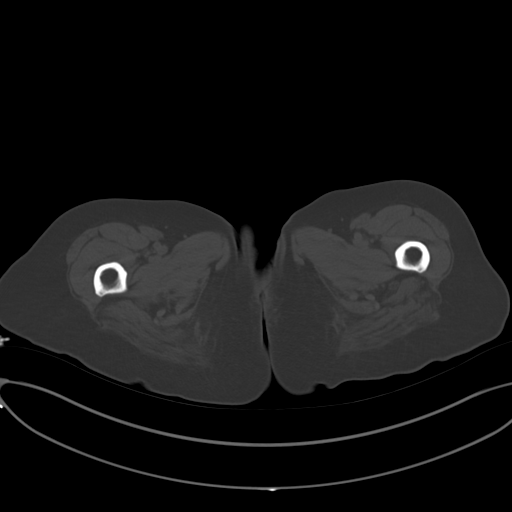
[im 10/87  soft-tissue]
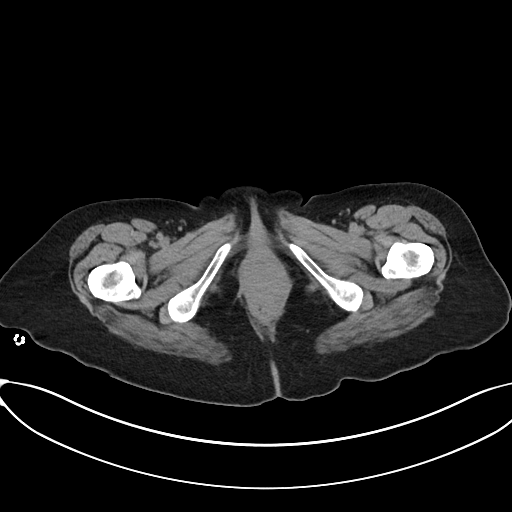
[im 20/87  soft-tissue]
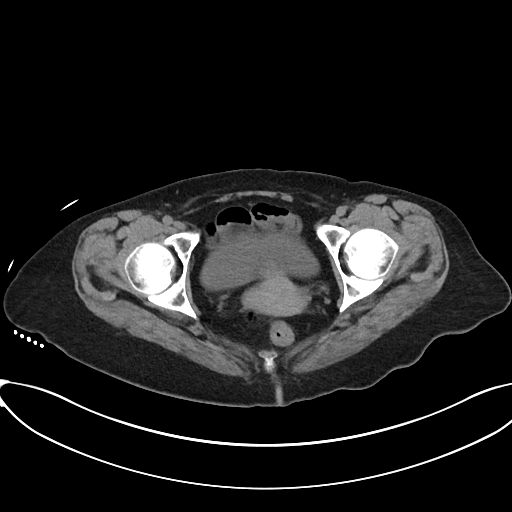
[im 24/87  soft-tissue]
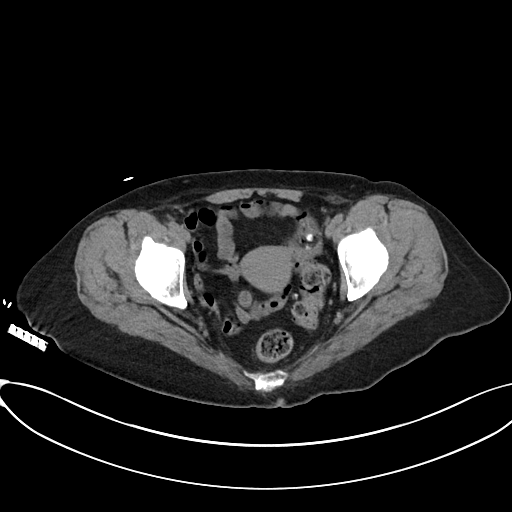
[im 29/87  soft-tissue]
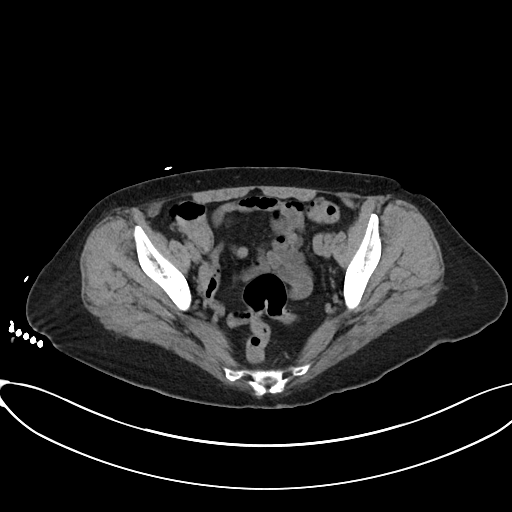
[im 39/87  soft-tissue]
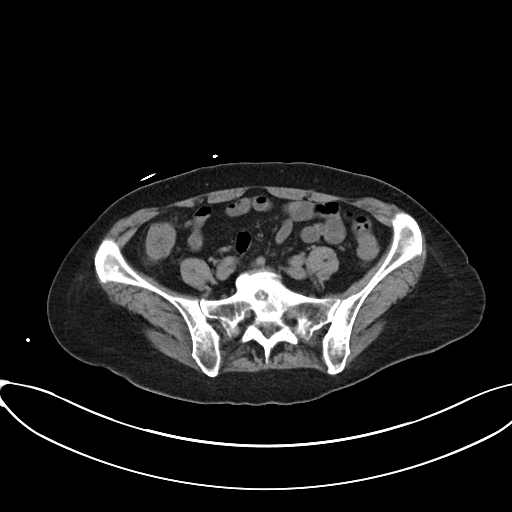
[im 44/87  soft-tissue]
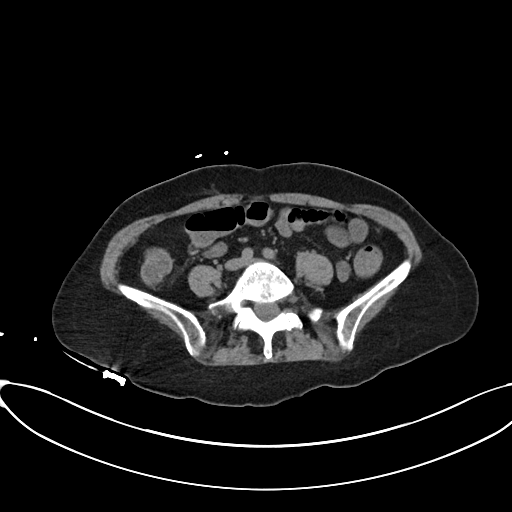
[im 48/87  soft-tissue]
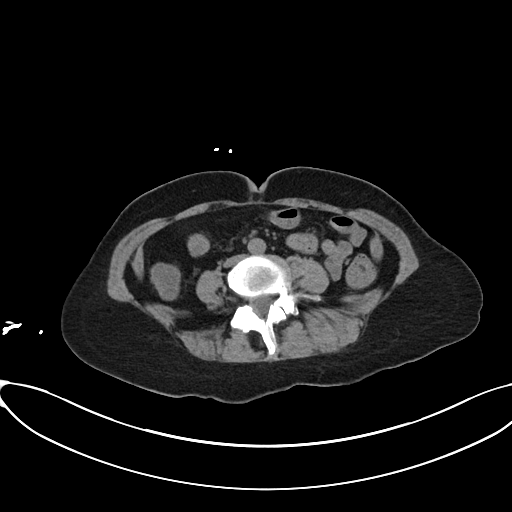
[im 58/87  soft-tissue]
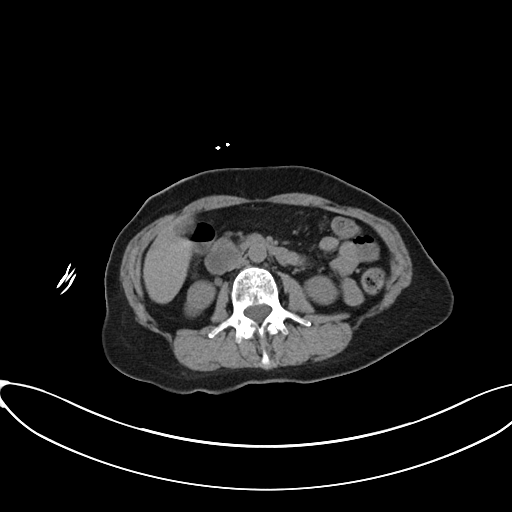
[im 58/87  bone]
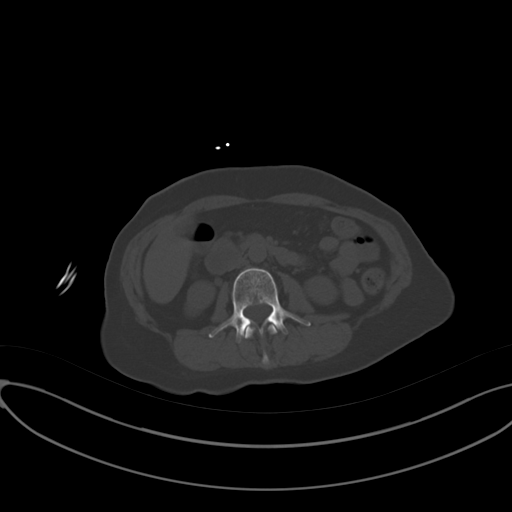
[im 63/87  soft-tissue]
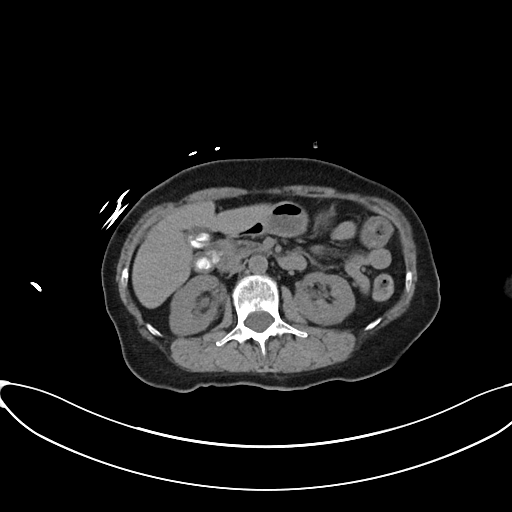
[im 67/87  soft-tissue]
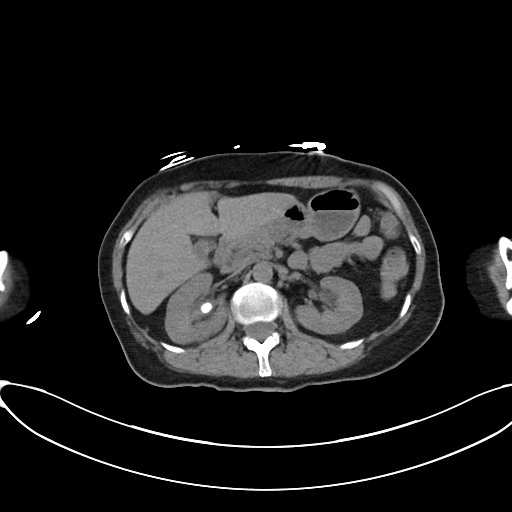
[im 77/87  soft-tissue]
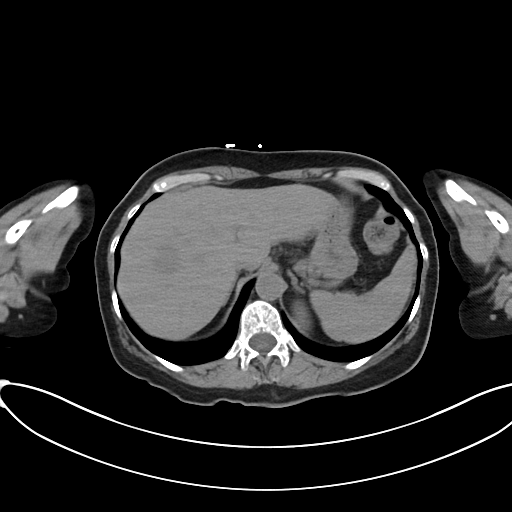
[im 82/87  soft-tissue]
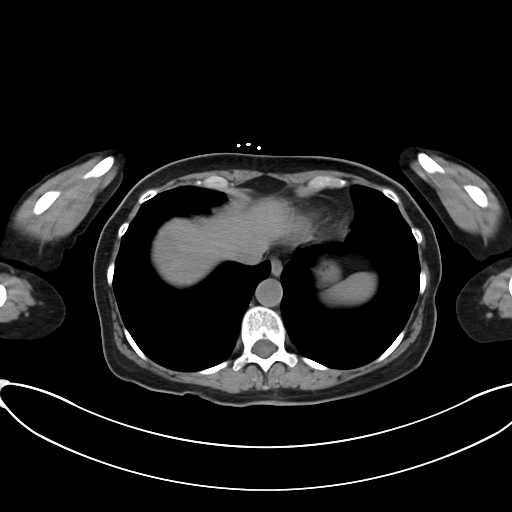

[Series 6: cor · coronal · 0.68mm/px · 3 of 77 slices shown]
[im 26/77  soft-tissue]
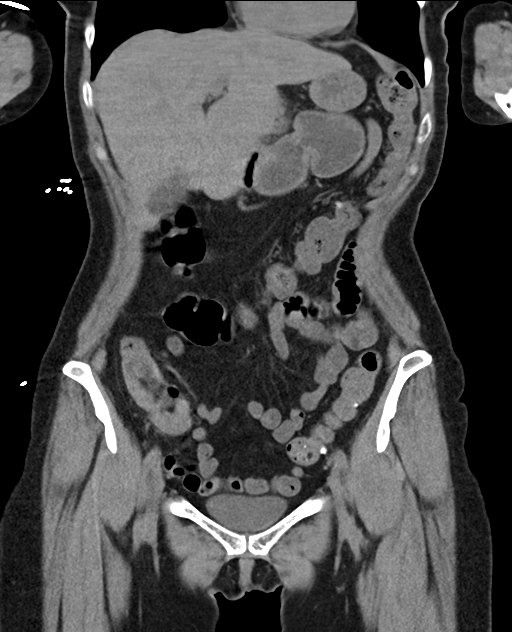
[im 34/77  soft-tissue]
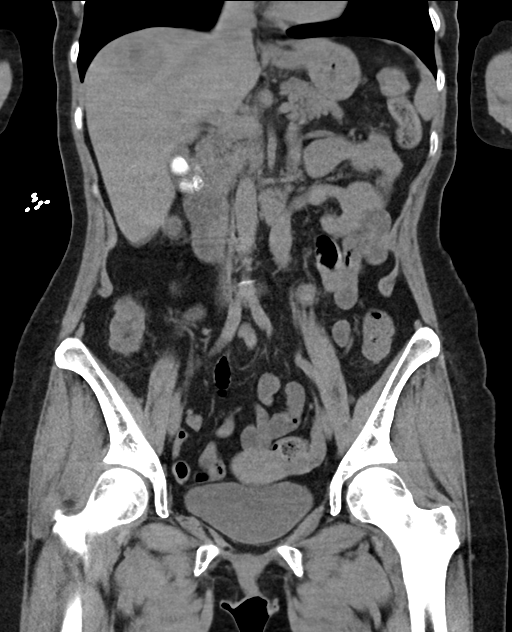
[im 43/77  soft-tissue]
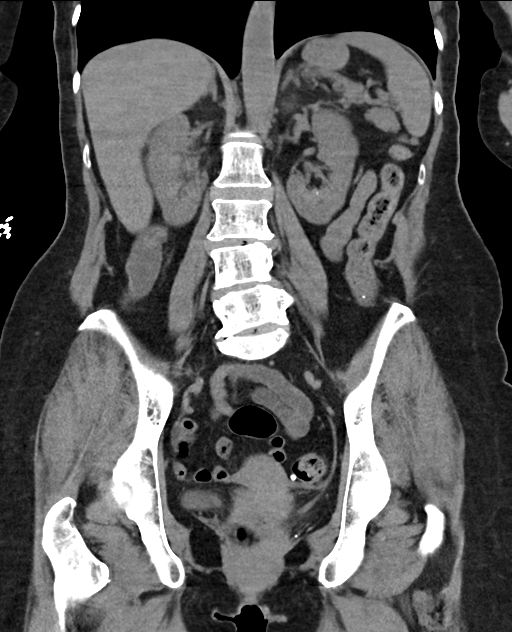

[16 of 46 positions shown; findings below may reference images not displayed]

FINDINGS: Lower chest: No acute abnormality. There is a 3.8 x 3.4 cm hypodense
mass lesion within the right lobe of the liver with central further
decreased attenuation likely representing necrosis. The gallbladder
is decompressed with multiple gallstones within. No biliary ductal
dilatation is noted.

Hepatobiliary: No focal liver abnormality is seen. No gallstones,
gallbladder wall thickening, or biliary dilatation.

Pancreas: Unremarkable. No pancreatic ductal dilatation or
surrounding inflammatory changes.

Spleen: Spleen is well visualized and within normal limits.

Adrenals/Urinary Tract: Adrenal glands are within normal limits.
Kidneys are well visualized bilaterally. Tiny nonobstructing stones
are seen bilaterally. Additionally a dominant 13 x 16 mm right renal
pelvic stone is seen. No obstructive changes are noted. Ureters are
within normal limits. Bladder is partially distended.

Stomach/Bowel: Mild diverticular change of the colon is noted. The
appendix is well visualized and within normal limits. Small bowel
and stomach appear unremarkable.

Vascular/Lymphatic: No significant vascular findings are present. No
enlarged abdominal or pelvic lymph nodes.

Reproductive: Uterus and bilateral adnexa are unremarkable.

Other: No abdominal wall hernia or abnormality. No abdominopelvic
ascites.

Musculoskeletal: No acute or significant osseous findings.
IMPRESSION: Hypodense mass lesion within the right lobe of the liver. The
central decreased attenuation suggests necrosis. Further workup is
recommended. Nonemergent MRI is recommended for evaluation.

Cholelithiasis without complicating factors.

Bilateral nonobstructing renal calculi. A large right renal pelvic
stone is noted as described.

Mild diverticular change without diverticulitis.

## 2023-04-21 ENCOUNTER — Ambulatory Visit
Admission: RE | Admit: 2023-04-21 | Discharge: 2023-04-21 | Disposition: A | Discharge status: Home / Self Care | Payer: Managed Care, Other (non HMO) | Source: Ambulatory Visit | Attending: Internal Medicine | Admitting: Internal Medicine

## 2023-04-21 ENCOUNTER — Other Ambulatory Visit: Payer: Self-pay | Admitting: Internal Medicine

## 2023-04-21 DIAGNOSIS — R319 Hematuria, unspecified: Secondary | ICD-10-CM

## 2023-05-11 ENCOUNTER — Other Ambulatory Visit: Payer: Self-pay | Admitting: Urology

## 2023-05-12 ENCOUNTER — Other Ambulatory Visit (HOSPITAL_COMMUNITY): Payer: Self-pay | Admitting: Urology

## 2023-05-12 DIAGNOSIS — N200 Calculus of kidney: Secondary | ICD-10-CM

## 2023-05-12 DIAGNOSIS — N1339 Other hydronephrosis: Secondary | ICD-10-CM

## 2023-07-27 NOTE — Progress Notes (Addendum)
COVID Vaccine received:  [x]  No []  Yes Date of any COVID positive Test in last 90 days: No PCP -Gwenyth Bender MD- [facility] Cardiologist -   Chest x-ray - 10/10/21 Epic EKG -   Stress Test - no ECHO - no Cardiac Cath -no   Bowel Prep - [x]  No  []   Yes ______  Pacemaker / ICD device [x]  No []  Yes   Spinal Cord Stimulator:[x]  No []  Yes       History of Sleep Apnea? [x]  No []  Yes   CPAP used?- [x]  No []  Yes    Does the patient monitor blood sugar?          [x]  No []  Yes  []  N/A  Patient has: [x]  NO Hx DM   []  Pre-DM                 []  DM1  []   DM2 Does patient have a [employer] or Dexacom? []  No []  Yes   Fasting Blood Sugar Ranges-  Checks Blood Sugar _____ times a day  GLP1 agonist / usual dose - no GLP1 instructions:  SGLT-2 inhibitors / usual dose - no SGLT-2 instructions:   Blood Thinner / Instructions:no Aspirin Instructions:no  Comments:   Activity level: Patient is able to climb a flight of stairs without difficulty; [x]  No CP  [x]  No SOB   Patient can perform ADLs without assistance.   Anesthesia review:   Patient denies shortness of breath, fever, cough and chest pain at PAT appointment.  Patient verbalized understanding and agreement to the Pre-Surgical Instructions that were given to them at this PAT appointment. Patient was also educated of the need to review these PAT instructions again prior to his/her surgery.I reviewed the appropriate phone numbers to call if they have any and questions or concerns.

## 2023-07-27 NOTE — Patient Instructions (Addendum)
SURGICAL WAITING ROOM VISITATION  Patients having surgery or a procedure may have no more than 2 support people in the waiting area - these visitors may rotate.    Children under the age of 50 must have an adult with them who is not the patient.  Due to an increase in RSV and influenza rates and associated hospitalizations, children ages 85 and under may not visit patients in [facility] hospitals.  If the patient needs to stay at the hospital during part of their recovery, the visitor guidelines for inpatient rooms apply. Pre-op nurse will coordinate an appropriate time for 1 support person to accompany patient in pre-op.  This support person may not rotate.    Please refer to the [facility] website for the visitor guidelines for Inpatients (after your surgery is over and you are in a regular room).       Your procedure is scheduled on: 08/01/23   Report to [facility] Main Entrance    Report to admitting at 8:45 AM   Call this number if you have problems the morning of surgery [phone]   Do not eat food or drink liquids :After Midnight.            Oral Hygiene is also important to reduce your risk of infection.                                    Remember - BRUSH YOUR TEETH THE MORNING OF SURGERY WITH YOUR REGULAR TOOTHPASTE   Stop all vitamins and herbal supplements 7 days before surgery.   Take these medicines the morning of surgery with A SIP OF WATER: none             You may not have any metal on your body including hair pins, jewelry, and body piercing             Do not wear make-up, lotions, powders, perfumes/cologne, or deodorant  Do not wear nail polish including gel and S&S, artificial/acrylic nails, or any other type of covering on natural nails including finger and toenails. If you have artificial nails, gel coating, etc. that needs to be removed by a nail salon please have this removed prior to surgery or surgery may need to be canceled/ delayed if the  surgeon/ anesthesia feels like they are unable to be safely monitored.   Do not shave  48 hours prior to surgery.    Do not bring valuables to the hospital. [facility] IS NOT             RESPONSIBLE   FOR VALUABLES.   Contacts, glasses, dentures or bridgework may not be worn into surgery.   Bring small overnight bag day of surgery.   DO NOT BRING YOUR HOME MEDICATIONS TO THE HOSPITAL. PHARMACY WILL DISPENSE MEDICATIONS LISTED ON YOUR MEDICATION LIST TO YOU DURING YOUR ADMISSION IN THE HOSPITAL!    Patients discharged on the day of surgery will not be allowed to drive home.  Someone NEEDS to stay with you for the first 24 hours after anesthesia.   Special Instructions: Bring a copy of your healthcare power of attorney and living will documents the day of surgery if you haven't scanned them before.              Please read over the following fact sheets you were given: IF YOU HAVE QUESTIONS ABOUT YOUR PRE-OP INSTRUCTIONS PLEASE  CALL [phone] Marissa Ayers   If you received a COVID test during your pre-op visit  it is requested that you wear a mask when out in public, stay away from anyone that may not be feeling well and notify your surgeon if you develop symptoms. If you test positive for Covid or have been in contact with anyone that has tested positive in the last 10 days please notify you surgeon.    [facility] - Preparing for Surgery Before surgery, you can play an important role.  Because skin is not sterile, your skin needs to be as free of germs as possible.  You can reduce the number of germs on your skin by washing with CHG (chlorahexidine gluconate) soap before surgery.  CHG is an antiseptic cleaner which kills germs and bonds with the skin to continue killing germs even after washing. Please DO NOT use if you have an allergy to CHG or antibacterial soaps.  If your skin becomes reddened/irritated stop using the CHG and inform your nurse when you arrive at Short Stay. Do not shave  (including legs and underarms) for at least 48 hours prior to the first CHG shower.  You may shave your face/neck.  Please follow these instructions carefully:  1.  Shower with CHG Soap the night before surgery and the  morning of surgery.  2.  If you choose to wash your hair, wash your hair first as usual with your normal  shampoo.  3.  After you shampoo, rinse your hair and body thoroughly to remove the shampoo.                             4.  Use CHG as you would any other liquid soap.  You can apply chg directly to the skin and wash.  Gently with a scrungie or clean washcloth.  5.  Apply the CHG Soap to your body ONLY FROM THE NECK DOWN.   Do   not use on face/ open                           Wound or open sores. Avoid contact with eyes, ears mouth and   genitals (private parts).                       Wash face,  Genitals (private parts) with your normal soap.             6.  Wash thoroughly, paying special attention to the area where your    surgery  will be performed.  7.  Thoroughly rinse your body with warm water from the neck down.  8.  DO NOT shower/wash with your normal soap after using and rinsing off the CHG Soap.                9.  Pat yourself dry with a clean towel.            10.  Wear clean pajamas.            11.  Place clean sheets on your bed the night of your first shower and do not  sleep with pets. Day of Surgery : Do not apply any lotions/deodorants the morning of surgery.  Please wear clean clothes to the hospital/surgery center.  FAILURE TO FOLLOW THESE INSTRUCTIONS MAY RESULT IN THE CANCELLATION OF YOUR SURGERY  PATIENT SIGNATURE_________________________________  NURSE  SIGNATURE__________________________________   ________________________________________________________________________ WHAT IS A BLOOD TRANSFUSION? Blood Transfusion Information  A transfusion is the replacement of blood or some of its parts. Blood is made up of multiple cells which provide  different functions. Red blood cells carry oxygen and are used for blood loss replacement. White blood cells fight against infection. Platelets control bleeding. Plasma helps clot blood. Other blood products are available for specialized needs, such as hemophilia or other clotting disorders. BEFORE THE TRANSFUSION  Who gives blood for transfusions?  Healthy volunteers who are fully evaluated to make sure their blood is safe. This is blood bank blood. Transfusion therapy is the safest it has ever been in the practice of medicine. Before blood is taken from a donor, a complete history is taken to make sure that person has no history of diseases nor engages in risky social behavior (examples are intravenous drug use or sexual activity with multiple partners). The donor's travel history is screened to minimize risk of transmitting infections, such as malaria. The donated blood is tested for signs of infectious diseases, such as HIV and hepatitis. The blood is then tested to be sure it is compatible with you in order to minimize the chance of a transfusion reaction. If you or a relative donates blood, this is often done in anticipation of surgery and is not appropriate for emergency situations. It takes many days to process the donated blood. RISKS AND COMPLICATIONS Although transfusion therapy is very safe and saves many lives, the main dangers of transfusion include:  Getting an infectious disease. Developing a transfusion reaction. This is an allergic reaction to something in the blood you were given. Every precaution is taken to prevent this. The decision to have a blood transfusion has been considered carefully by your caregiver before blood is given. Blood is not given unless the benefits outweigh the risks. AFTER THE TRANSFUSION Right after receiving a blood transfusion, you will usually feel much better and more energetic. This is especially true if your red blood cells have gotten low (anemic).  The transfusion raises the level of the red blood cells which carry oxygen, and this usually causes an energy increase. The nurse administering the transfusion will monitor you carefully for complications. HOME CARE INSTRUCTIONS  No special instructions are needed after a transfusion. You may find your energy is better. Speak with your caregiver about any limitations on activity for underlying diseases you may have. SEEK MEDICAL CARE IF:  Your condition is not improving after your transfusion. You develop redness or irritation at the intravenous (IV) site. SEEK IMMEDIATE MEDICAL CARE IF:  Any of the following symptoms occur over the next 12 hours: Shaking chills. You have a temperature by mouth above 102 F (38.9 C), not controlled by medicine. Chest, back, or muscle pain. People around you feel you are not acting correctly or are confused. Shortness of breath or difficulty breathing. Dizziness and fainting. You get a rash or develop hives. You have a decrease in urine output. Your urine turns a dark color or changes to pink, red, or brown. Any of the following symptoms occur over the next 10 days: You have a temperature by mouth above 102 F (38.9 C), not controlled by medicine. Shortness of breath. Weakness after normal activity. The white part of the eye turns yellow (jaundice). You have a decrease in the amount of urine or are urinating less often. Your urine turns a dark color or changes to pink, red, or brown. Document Released: 11/05/2000 Document Revised: 01/31/2012  Document Reviewed: 06/24/2008 [employer] Patient Information 2014 [place], [place].

## 2023-07-29 ENCOUNTER — Other Ambulatory Visit: Payer: Self-pay

## 2023-07-29 ENCOUNTER — Encounter (HOSPITAL_COMMUNITY)
Admission: RE | Admit: 2023-07-29 | Discharge: 2023-07-29 | Disposition: A | Discharge status: Home / Self Care | Payer: Managed Care, Other (non HMO) | Source: Ambulatory Visit | Attending: Urology | Admitting: Urology

## 2023-07-29 ENCOUNTER — Other Ambulatory Visit: Payer: Self-pay | Admitting: Physician Assistant

## 2023-07-29 ENCOUNTER — Encounter (HOSPITAL_COMMUNITY): Payer: Self-pay | Admitting: *Deleted

## 2023-07-29 DIAGNOSIS — Z01818 Encounter for other preprocedural examination: Secondary | ICD-10-CM

## 2023-07-29 DIAGNOSIS — Z01812 Encounter for preprocedural laboratory examination: Secondary | ICD-10-CM | POA: Insufficient documentation

## 2023-07-29 HISTORY — DX: Personal history of urinary calculi: Z87.442

## 2023-07-29 HISTORY — DX: Anemia, unspecified: D64.9

## 2023-07-29 HISTORY — DX: Unspecified osteoarthritis, unspecified site: M19.90

## 2023-07-29 LAB — BASIC METABOLIC PANEL
Anion gap: 9 (ref 5–15)
BUN: 16 mg/dL (ref 6–20)
CO2: 27 mmol/L (ref 22–32)
Calcium: 9.6 mg/dL (ref 8.9–10.3)
Chloride: 104 mmol/L (ref 98–111)
Creatinine, Ser: 0.69 mg/dL (ref 0.44–1.00)
GFR, Estimated: >60 mL/min (ref >60)
Glucose, Bld: 87 mg/dL (ref 70–99)
Potassium: 3.9 mmol/L (ref 3.5–5.1)
Sodium: 140 mmol/L (ref 135–145)

## 2023-07-29 LAB — CBC
HCT: 34.8 % — ABNORMAL LOW (ref 36.0–46.0)
Hemoglobin: 11.1 g/dL — ABNORMAL LOW (ref 12.0–15.0)
MCH: 27.5 pg (ref 26.0–34.0)
MCHC: 31.9 g/dL (ref 30.0–36.0)
MCV: 86.1 fL (ref 80.0–100.0)
Platelets: 189 10*3/uL (ref 150–400)
RBC: 4.04 MIL/uL (ref 3.87–5.11)
RDW: 14.5 % (ref 11.5–15.5)
WBC: 5.6 10*3/uL (ref 4.0–10.5)
nRBC: 0 % (ref 0.0–0.2)

## 2023-07-31 NOTE — Consult Note (Signed)
Chief Complaint: Patient was seen in consultation today for  right percutaneous nephrostomy/nephroureteral catheter placement    Referring Physician(s): Bell,E  Supervising Physician: Mir, Mauri Reading  Patient Status: [facility] - Out-pt  TBA  History of Present Illness: Marissa Ayers is a 57 y.o. female with PMH sig for anemia, anxiety, LBP, and renal stones along with hematuria. CT A/P on 04/21/23 revealed:   1. Mild right hydronephrosis associated with a 2 cm renal pelvis stone that has enlarged from a 2022 comparison. Additional small renal calculi. 2. 4.5 cm right liver mass which has mildly enlarged from 2022, recommend follow-up for enhanced liver MRI, if not performed at outside facility. 3. Cholelithiasis  She presents today for right PCN/NU cath placement prior to PCNL.   Past Medical History:  Diagnosis Date   Anemia    Anxiety    Arthritis    History of kidney stones    Low back pain with sciatica    Lumbar herniated disc    L5-S1    No past surgical history on file.  Allergies: Cymbalta [duloxetine hcl], Lexapro [escitalopram], and Bactrim [sulfamethoxazole-trimethoprim]  Medications: Prior to Admission medications   Medication Sig Start Date End Date Taking? Authorizing Provider  ALPRAZolam Prudy Feeler) 0.5 MG tablet Take 0.25 mg by mouth at bedtime. 05/23/23  Yes [provider]  naproxen sodium (ALEVE) 220 MG tablet Take 440 mg by mouth every 6 (six) hours as needed (pain).   Yes [provider]  nitrofurantoin, macrocrystal-monohydrate, (MACROBID) 100 MG capsule Take 100 mg by mouth at bedtime. 06/27/23  Yes [provider]     No family history on file.  Social History   Socioeconomic History   Marital status: Married    Spouse name: Not on file   Number of children: Not on file   Years of education: Not on file   Highest education level: Not on file  Occupational History   Not on file  Tobacco Use   Smoking status: Never    Smokeless tobacco: Never  Substance and Sexual Activity   Alcohol use: Yes    Comment: 1 occasionally   Drug use: Never   Sexual activity: Not on file  Other Topics Concern   Not on file  Social History Narrative   Not on file   Social Determinants of Health   Financial Resource Strain: Not on file  Food Insecurity: Not on file  Transportation Needs: Not on file  Physical Activity: Not on file  Stress: Not on file  Social Connections: Not on file    Review of Systems  Vital Signs: LMP 05/19/2015 Comment: bc pill  Code Status:   Physical Exam  Imaging: No results found.  Labs:  CBC: Recent Labs    07/29/23 1330  WBC 5.6  HGB 11.1*  HCT 34.8*  PLT 189    COAGS: No results for input(s): "INR", "APTT" in the last 8760 hours.  BMP: Recent Labs    07/29/23 1330  NA 140  K 3.9  CL 104  CO2 27  GLUCOSE 87  BUN 16  CALCIUM 9.6  CREATININE 0.69  GFRNONAA >60    LIVER FUNCTION TESTS: No results for input(s): "BILITOT", "AST", "ALT", "ALKPHOS", "PROT", "ALBUMIN" in the last 8760 hours.  TUMOR MARKERS: No results for input(s): "AFPTM", "CEA", "CA199", "CHROMGRNA" in the last 8760 hours.  Assessment and Plan:  57 y.o. female with PMH sig for anemia, anxiety, LBP, and renal stones along with hematuria. CT A/P on 04/21/23 revealed:  1. Mild right hydronephrosis associated with a 2 cm renal pelvis stone that has enlarged from a 2022 comparison. Additional small renal calculi. 2. 4.5 cm right liver mass which has mildly enlarged from 2022, recommend follow-up for enhanced liver MRI, if not performed at outside facility. 3. Cholelithiasis  She presents today for right PCN/NU cath placement prior to PCNL. Risks and benefits of right PCN placement was discussed with the patient including, but not limited to, infection, bleeding, significant bleeding causing loss or decrease in renal function or damage to adjacent structures.   All of the patient's questions  were answered, patient is agreeable to proceed.  Consent signed and in chart.      Thank you for this interesting consult.  I greatly enjoyed meeting Marissa Ayers and look forward to participating in their care.  A copy of this report was sent to the requesting provider on this date.  Electronically Signed: D. Jeananne Rama, PA-C 07/31/2023, 5:08 PM   I spent a total of   25 minutes  in face to face in clinical consultation, greater than 50% of which was counseling/coordinating care for right percutaneous nephrostomy/nephroureteral cath placement

## 2023-08-01 ENCOUNTER — Inpatient Hospital Stay (HOSPITAL_COMMUNITY)
Admission: AD | Admit: 2023-08-01 | Discharge: 2023-08-03 | DRG: 660 | Disposition: A | Discharge status: Home / Self Care | Payer: Managed Care, Other (non HMO) | Source: Ambulatory Visit | Attending: Urology | Admitting: Urology

## 2023-08-01 ENCOUNTER — Ambulatory Visit (HOSPITAL_BASED_OUTPATIENT_CLINIC_OR_DEPARTMENT_OTHER): Payer: Managed Care, Other (non HMO) | Admitting: Certified Registered"

## 2023-08-01 ENCOUNTER — Ambulatory Visit (HOSPITAL_COMMUNITY)
Admission: RE | Admit: 2023-08-01 | Discharge: 2023-08-01 | Disposition: A | Discharge status: Home / Self Care | Payer: Managed Care, Other (non HMO) | Source: Ambulatory Visit | Attending: Urology | Admitting: Urology

## 2023-08-01 ENCOUNTER — Encounter (HOSPITAL_COMMUNITY): Payer: Self-pay | Admitting: Urology

## 2023-08-01 ENCOUNTER — Other Ambulatory Visit: Payer: Self-pay

## 2023-08-01 ENCOUNTER — Ambulatory Visit (HOSPITAL_COMMUNITY): Payer: Managed Care, Other (non HMO) | Admitting: Certified Registered"

## 2023-08-01 ENCOUNTER — Encounter (HOSPITAL_COMMUNITY)
Admission: AD | Disposition: A | Discharge status: Home / Self Care | Payer: Self-pay | Source: Ambulatory Visit | Attending: Urology

## 2023-08-01 ENCOUNTER — Ambulatory Visit (HOSPITAL_COMMUNITY): Payer: Managed Care, Other (non HMO)

## 2023-08-01 ENCOUNTER — Ambulatory Visit (HOSPITAL_COMMUNITY)
Admission: RE | Admit: 2023-08-01 | Discharge: 2023-08-01 | Discharge status: Home / Self Care | Payer: Managed Care, Other (non HMO) | Source: Ambulatory Visit | Attending: Urology

## 2023-08-01 DIAGNOSIS — Z01818 Encounter for other preprocedural examination: Secondary | ICD-10-CM

## 2023-08-01 DIAGNOSIS — N201 Calculus of ureter: Principal | ICD-10-CM

## 2023-08-01 DIAGNOSIS — F419 Anxiety disorder, unspecified: Secondary | ICD-10-CM | POA: Diagnosis present

## 2023-08-01 DIAGNOSIS — Z79899 Other long term (current) drug therapy: Secondary | ICD-10-CM

## 2023-08-01 DIAGNOSIS — Z888 Allergy status to other drugs, medicaments and biological substances status: Secondary | ICD-10-CM

## 2023-08-01 DIAGNOSIS — N132 Hydronephrosis with renal and ureteral calculous obstruction: Principal | ICD-10-CM | POA: Diagnosis present

## 2023-08-01 DIAGNOSIS — N202 Calculus of kidney with calculus of ureter: Secondary | ICD-10-CM | POA: Diagnosis not present

## 2023-08-01 DIAGNOSIS — D62 Acute posthemorrhagic anemia: Secondary | ICD-10-CM | POA: Diagnosis not present

## 2023-08-01 DIAGNOSIS — N200 Calculus of kidney: Secondary | ICD-10-CM

## 2023-08-01 HISTORY — PX: IR URETERAL STENT RIGHT NEW ACCESS W/O SEP NEPHROSTOMY CATH: IMG6076

## 2023-08-01 HISTORY — PX: NEPHROLITHOTOMY: SHX5134

## 2023-08-01 LAB — CBC
HCT: 32.8 % — ABNORMAL LOW (ref 36.0–46.0)
Hemoglobin: 10.5 g/dL — ABNORMAL LOW (ref 12.0–15.0)
MCH: 27.4 pg (ref 26.0–34.0)
MCHC: 32 g/dL (ref 30.0–36.0)
MCV: 85.6 fL (ref 80.0–100.0)
Platelets: 190 10*3/uL (ref 150–400)
RBC: 3.83 MIL/uL — ABNORMAL LOW (ref 3.87–5.11)
RDW: 14.4 % (ref 11.5–15.5)
WBC: 4.5 10*3/uL (ref 4.0–10.5)
nRBC: 0 % (ref 0.0–0.2)

## 2023-08-01 LAB — PROTIME-INR
INR: 1 (ref 0.8–1.2)
Prothrombin Time: 13.2 s (ref 11.4–15.2)

## 2023-08-01 LAB — BASIC METABOLIC PANEL
Anion gap: 8 (ref 5–15)
BUN: 11 mg/dL (ref 6–20)
CO2: 24 mmol/L (ref 22–32)
Calcium: 7.6 mg/dL — ABNORMAL LOW (ref 8.9–10.3)
Chloride: 108 mmol/L (ref 98–111)
Creatinine, Ser: 0.55 mg/dL (ref 0.44–1.00)
GFR, Estimated: >60 mL/min (ref >60)
Glucose, Bld: 140 mg/dL — ABNORMAL HIGH (ref 70–99)
Potassium: 3.3 mmol/L — ABNORMAL LOW (ref 3.5–5.1)
Sodium: 140 mmol/L (ref 135–145)

## 2023-08-01 LAB — ABO/RH: ABO/RH(D): O POS

## 2023-08-01 LAB — HEMOGLOBIN AND HEMATOCRIT, BLOOD
HCT: 28 % — ABNORMAL LOW (ref 36.0–46.0)
Hemoglobin: 8.8 g/dL — ABNORMAL LOW (ref 12.0–15.0)

## 2023-08-01 LAB — HIV ANTIBODY (ROUTINE TESTING W REFLEX): HIV Screen 4th Generation wRfx: NONREACTIVE

## 2023-08-01 SURGERY — NEPHROLITHOTOMY PERCUTANEOUS
Anesthesia: General | Laterality: Right

## 2023-08-01 MED ORDER — LIDOCAINE HCL (CARDIAC) PF 100 MG/5ML IV SOSY
PREFILLED_SYRINGE | INTRAVENOUS | Status: DC | PRN
Start: 2023-08-01 — End: 2023-08-01
  Administered 2023-08-01: 50 mg via INTRATRACHEAL

## 2023-08-01 MED ORDER — SUGAMMADEX SODIUM 200 MG/2ML IV SOLN
INTRAVENOUS | Status: DC | PRN
  Administered 2023-08-01: 200 mg via INTRAVENOUS

## 2023-08-01 MED ORDER — MIDAZOLAM HCL 2 MG/2ML IJ SOLN
INTRAMUSCULAR | Status: AC | PRN
Start: 2023-08-01 — End: 2023-08-01
  Administered 2023-08-01: 1 mg via INTRAVENOUS

## 2023-08-01 MED ORDER — ONDANSETRON HCL 4 MG/2ML IJ SOLN: 4.0000 mg | Freq: Once | INTRAMUSCULAR | Status: DC | PRN

## 2023-08-01 MED ORDER — TAMSULOSIN HCL 0.4 MG PO CAPS: 0.4000 mg | ORAL_CAPSULE | Freq: Every day | ORAL | 0 refills | Status: AC

## 2023-08-01 MED ORDER — OXYBUTYNIN CHLORIDE 5 MG PO TABS: 5.0000 mg | ORAL_TABLET | Freq: Three times a day (TID) | ORAL | Status: DC | PRN

## 2023-08-01 MED ORDER — FENTANYL CITRATE (PF) 100 MCG/2ML IJ SOLN
INTRAMUSCULAR | Status: AC
  Filled 2023-08-01: qty 2

## 2023-08-01 MED ORDER — IOHEXOL 300 MG/ML  SOLN
INTRAMUSCULAR | Status: DC | PRN
  Administered 2023-08-01: 14 mL

## 2023-08-01 MED ORDER — SODIUM CHLORIDE 0.9 % IR SOLN
Status: DC | PRN
Start: 2023-08-01 — End: 2023-08-01
  Administered 2023-08-01: 15000 mL via INTRAVESICAL

## 2023-08-01 MED ORDER — OXYCODONE HCL 5 MG PO TABS
5.0000 mg | ORAL_TABLET | Freq: Once | ORAL | Status: AC | PRN
  Administered 2023-08-01: 5 mg via ORAL

## 2023-08-01 MED ORDER — LACTATED RINGERS IV SOLN: INTRAVENOUS | Status: DC

## 2023-08-01 MED ORDER — FENTANYL CITRATE (PF) 100 MCG/2ML IJ SOLN
INTRAMUSCULAR | Status: AC
  Filled 2023-08-01: qty 4

## 2023-08-01 MED ORDER — ONDANSETRON HCL 4 MG/2ML IJ SOLN
INTRAMUSCULAR | Status: DC | PRN
Start: 2023-08-01 — End: 2023-08-01
  Administered 2023-08-01: 4 mg via INTRAVENOUS

## 2023-08-01 MED ORDER — LIDOCAINE-EPINEPHRINE 1 %-1:100000 IJ SOLN
INTRAMUSCULAR | Status: AC
  Filled 2023-08-01: qty 1

## 2023-08-01 MED ORDER — ORAL CARE MOUTH RINSE: 15.0000 mL | Freq: Once | OROMUCOSAL | Status: DC

## 2023-08-01 MED ORDER — ONDANSETRON HCL 4 MG/2ML IJ SOLN: 4.0000 mg | INTRAMUSCULAR | Status: DC | PRN

## 2023-08-01 MED ORDER — HYDROMORPHONE HCL 1 MG/ML IJ SOLN
0.2500 mg | INTRAMUSCULAR | Status: DC | PRN
  Administered 2023-08-01 (×4): 0.5 mg via INTRAVENOUS

## 2023-08-01 MED ORDER — SODIUM CHLORIDE 0.9 % IR SOLN
Status: DC | PRN
  Administered 2023-08-01: 1000 mL

## 2023-08-01 MED ORDER — HYDROMORPHONE HCL 1 MG/ML IJ SOLN
INTRAMUSCULAR | Status: AC
  Filled 2023-08-01: qty 1

## 2023-08-01 MED ORDER — DEXAMETHASONE SODIUM PHOSPHATE 10 MG/ML IJ SOLN
INTRAMUSCULAR | Status: AC
  Filled 2023-08-01: qty 1

## 2023-08-01 MED ORDER — MIDAZOLAM HCL 2 MG/2ML IJ SOLN
INTRAMUSCULAR | Status: AC
  Filled 2023-08-01: qty 2

## 2023-08-01 MED ORDER — CEFAZOLIN SODIUM-DEXTROSE 2-4 GM/100ML-% IV SOLN
2.0000 g | INTRAVENOUS | Status: AC
  Administered 2023-08-01: 2 g via INTRAVENOUS
  Filled 2023-08-01: qty 100

## 2023-08-01 MED ORDER — ACETAMINOPHEN 500 MG PO TABS
1000.0000 mg | ORAL_TABLET | Freq: Once | ORAL | Status: AC
  Administered 2023-08-01: 1000 mg via ORAL
  Filled 2023-08-01: qty 2

## 2023-08-01 MED ORDER — ROCURONIUM BROMIDE 100 MG/10ML IV SOLN
INTRAVENOUS | Status: DC | PRN
Start: 2023-08-01 — End: 2023-08-01
  Administered 2023-08-01: 50 mg via INTRAVENOUS

## 2023-08-01 MED ORDER — LIDOCAINE HCL (PF) 2 % IJ SOLN
INTRAMUSCULAR | Status: AC
  Filled 2023-08-01: qty 5

## 2023-08-01 MED ORDER — CHLORHEXIDINE GLUCONATE 0.12 % MT SOLN: 15.0000 mL | Freq: Once | OROMUCOSAL | Status: DC

## 2023-08-01 MED ORDER — DEXTROSE 5 % IV SOLN
INTRAVENOUS | Status: DC | PRN
  Administered 2023-08-01: 2 g via INTRAVENOUS

## 2023-08-01 MED ORDER — ALPRAZOLAM 0.25 MG PO TABS
0.2500 mg | ORAL_TABLET | Freq: Every day | ORAL | Status: DC
  Administered 2023-08-01 – 2023-08-02 (×2): 0.25 mg via ORAL
  Filled 2023-08-01 (×2): qty 1

## 2023-08-01 MED ORDER — ZOLPIDEM TARTRATE 5 MG PO TABS: 5.0000 mg | ORAL_TABLET | Freq: Every evening | ORAL | Status: DC | PRN

## 2023-08-01 MED ORDER — LIDOCAINE-EPINEPHRINE 1 %-1:100000 IJ SOLN
20.0000 mL | Freq: Once | INTRAMUSCULAR | Status: AC
  Administered 2023-08-01: 2 mL via INTRADERMAL

## 2023-08-01 MED ORDER — HYDROCODONE-ACETAMINOPHEN 5-325 MG PO TABS: 1.0000 | ORAL_TABLET | ORAL | 0 refills | Status: AC | PRN

## 2023-08-01 MED ORDER — DIPHENHYDRAMINE HCL 50 MG/ML IJ SOLN: 12.5000 mg | Freq: Four times a day (QID) | INTRAMUSCULAR | Status: DC | PRN

## 2023-08-01 MED ORDER — MIDAZOLAM HCL 2 MG/2ML IJ SOLN
INTRAMUSCULAR | Status: DC | PRN
Start: 2023-08-01 — End: 2023-08-01
  Administered 2023-08-01: 2 mg via INTRAVENOUS

## 2023-08-01 MED ORDER — SENNA 8.6 MG PO TABS
1.0000 | ORAL_TABLET | Freq: Two times a day (BID) | ORAL | Status: DC
  Administered 2023-08-01 – 2023-08-03 (×4): 8.6 mg via ORAL
  Filled 2023-08-01 (×4): qty 1

## 2023-08-01 MED ORDER — TRIPLE ANTIBIOTIC 3.5-400-5000 EX OINT: 1.0000 | TOPICAL_OINTMENT | Freq: Three times a day (TID) | CUTANEOUS | Status: DC | PRN

## 2023-08-01 MED ORDER — OXYCODONE HCL 5 MG PO TABS
ORAL_TABLET | ORAL | Status: AC
  Filled 2023-08-01: qty 1

## 2023-08-01 MED ORDER — OXYCODONE HCL 5 MG PO TABS
5.0000 mg | ORAL_TABLET | ORAL | Status: DC | PRN
  Administered 2023-08-02 – 2023-08-03 (×7): 5 mg via ORAL
  Filled 2023-08-01 (×7): qty 1

## 2023-08-01 MED ORDER — MIDAZOLAM HCL 2 MG/2ML IJ SOLN
INTRAMUSCULAR | Status: AC
  Filled 2023-08-01: qty 4

## 2023-08-01 MED ORDER — ONDANSETRON HCL 4 MG/2ML IJ SOLN
INTRAMUSCULAR | Status: AC
  Filled 2023-08-01: qty 2

## 2023-08-01 MED ORDER — PROPOFOL 10 MG/ML IV BOLUS
INTRAVENOUS | Status: DC | PRN
Start: 2023-08-01 — End: 2023-08-01
  Administered 2023-08-01: 100 mg via INTRAVENOUS

## 2023-08-01 MED ORDER — SODIUM CHLORIDE 0.9 % IV SOLN: INTRAVENOUS | Status: DC

## 2023-08-01 MED ORDER — DIPHENHYDRAMINE HCL 12.5 MG/5ML PO ELIX: 12.5000 mg | ORAL_SOLUTION | Freq: Four times a day (QID) | ORAL | Status: DC | PRN

## 2023-08-01 MED ORDER — FENTANYL CITRATE (PF) 100 MCG/2ML IJ SOLN
INTRAMUSCULAR | Status: AC | PRN
Start: 2023-08-01 — End: 2023-08-01
  Administered 2023-08-01: 50 ug via INTRAVENOUS

## 2023-08-01 MED ORDER — IOHEXOL 300 MG/ML  SOLN
50.0000 mL | Freq: Once | INTRAMUSCULAR | Status: AC | PRN
  Administered 2023-08-01: 10 mL

## 2023-08-01 MED ORDER — DEXAMETHASONE SODIUM PHOSPHATE 10 MG/ML IJ SOLN
INTRAMUSCULAR | Status: DC | PRN
  Administered 2023-08-01: 10 mg via INTRAVENOUS

## 2023-08-01 MED ORDER — MORPHINE SULFATE (PF) 2 MG/ML IV SOLN
2.0000 mg | INTRAVENOUS | Status: DC | PRN
  Administered 2023-08-01 – 2023-08-02 (×3): 4 mg via INTRAVENOUS
  Filled 2023-08-01 (×3): qty 2

## 2023-08-01 MED ORDER — FENTANYL CITRATE (PF) 100 MCG/2ML IJ SOLN
INTRAMUSCULAR | Status: DC | PRN
Start: 2023-08-01 — End: 2023-08-01
  Administered 2023-08-01: 50 ug via INTRAVENOUS
  Administered 2023-08-01: 25 ug via INTRAVENOUS
  Administered 2023-08-01: 50 ug via INTRAVENOUS
  Administered 2023-08-01: 75 ug via INTRAVENOUS

## 2023-08-01 MED ORDER — AMISULPRIDE (ANTIEMETIC) 5 MG/2ML IV SOLN: 10.0000 mg | Freq: Once | INTRAVENOUS | Status: DC | PRN

## 2023-08-01 MED ORDER — SODIUM CHLORIDE 0.9 % IV SOLN
2.0000 g | Freq: Once | INTRAVENOUS | Status: AC
  Administered 2023-08-01: 2 g via INTRAVENOUS
  Filled 2023-08-01: qty 20

## 2023-08-01 MED ORDER — DOCUSATE SODIUM 100 MG PO CAPS
100.0000 mg | ORAL_CAPSULE | Freq: Two times a day (BID) | ORAL | Status: DC
  Administered 2023-08-01 – 2023-08-03 (×4): 100 mg via ORAL
  Filled 2023-08-01 (×4): qty 1

## 2023-08-01 MED ORDER — ACETAMINOPHEN 325 MG PO TABS
650.0000 mg | ORAL_TABLET | ORAL | Status: DC | PRN
  Administered 2023-08-02: 650 mg via ORAL
  Filled 2023-08-01: qty 2

## 2023-08-01 MED ORDER — OXYCODONE HCL 5 MG/5ML PO SOLN: 5.0000 mg | Freq: Once | ORAL | Status: AC | PRN

## 2023-08-01 SURGICAL SUPPLY — 51 items
APL PRP STRL LF DISP 70% ISPRP (MISCELLANEOUS) ×1
APL SKNCLS STERI-STRIP NONHPOA (GAUZE/BANDAGES/DRESSINGS) ×1
BAG COUNTER SPONGE SURGICOUNT (BAG) IMPLANT
BAG DRN RND TRDRP ANRFLXCHMBR (UROLOGICAL SUPPLIES)
BAG SPNG CNTER NS LX DISP (BAG)
BAG URINE DRAIN 2000ML AR STRL (UROLOGICAL SUPPLIES) IMPLANT
BASKET ZERO TIP NITINOL 2.4FR (BASKET) IMPLANT
BENZOIN TINCTURE PRP APPL 2/3 (GAUZE/BANDAGES/DRESSINGS) ×1 IMPLANT
BLADE SURG 15 STRL LF DISP TIS (BLADE) ×1 IMPLANT
BLADE SURG 15 STRL SS (BLADE) ×1
BSKT STON RTRVL ZERO TP 2.4FR (BASKET)
CATH FOLEY 2W COUNCIL 20FR 5CC (CATHETERS) IMPLANT
CATH ROBINSON RED A/P 20FR (CATHETERS) IMPLANT
CATH URETERAL DUAL LUMEN 10F (MISCELLANEOUS) ×1 IMPLANT
CATH X-FORCE N30 NEPHROSTOMY (TUBING) ×1 IMPLANT
CHLORAPREP W/TINT 26 (MISCELLANEOUS) ×1 IMPLANT
COVER BACK TABLE 60X90IN (DRAPES) ×1 IMPLANT
COVER SURGICAL LIGHT HANDLE (MISCELLANEOUS) IMPLANT
DRAPE C-ARM 42X120 X-RAY (DRAPES) ×1 IMPLANT
DRAPE LINGEMAN PERC (DRAPES) ×1 IMPLANT
DRAPE SURG IRRIG POUCH 19X23 (DRAPES) ×1 IMPLANT
DRSG TEGADERM 8X12 (GAUZE/BANDAGES/DRESSINGS) IMPLANT
GAUZE PAD ABD 8X10 STRL (GAUZE/BANDAGES/DRESSINGS) ×2 IMPLANT
GAUZE SPONGE 4X4 12PLY STRL (GAUZE/BANDAGES/DRESSINGS) IMPLANT
GLOVE BIO SURGEON STRL SZ7.5 (GLOVE) ×1 IMPLANT
GOWN STRL REUS W/ TWL XL LVL3 (GOWN DISPOSABLE) ×1 IMPLANT
GOWN STRL REUS W/TWL XL LVL3 (GOWN DISPOSABLE) ×1
GUIDEWIRE AMPLAZ .035X145 (WIRE) ×2 IMPLANT
KIT BASIN OR (CUSTOM PROCEDURE TRAY) ×1 IMPLANT
KIT PROBE TRILOGY 3.9X350 (MISCELLANEOUS) IMPLANT
KIT TURNOVER KIT A (KITS) IMPLANT
LASER FIB FLEXIVA PULSE ID 365 (Laser) IMPLANT
LUBRICANT JELLY K Y 4OZ (MISCELLANEOUS) ×1 IMPLANT
MANIFOLD NEPTUNE II (INSTRUMENTS) ×1 IMPLANT
NS IRRIG 1000ML POUR BTL (IV SOLUTION) ×1 IMPLANT
PACK CYSTO (CUSTOM PROCEDURE TRAY) IMPLANT
SPONGE T-LAP 4X18 ~~LOC~~+RFID (SPONGE) ×1 IMPLANT
STENT ENDOURETEROTOMY 7-14 26C (STENTS) IMPLANT
STENT URET 6FRX26 CONTOUR (STENTS) IMPLANT
SUT CHROMIC 3 0 SH 27 (SUTURE) IMPLANT
SUT SILK 2 0 30 PSL (SUTURE) IMPLANT
SYR 10ML LL (SYRINGE) ×1 IMPLANT
SYR 20ML LL LF (SYRINGE) ×1 IMPLANT
TOWEL OR 17X26 10 PK STRL BLUE (TOWEL DISPOSABLE) ×1 IMPLANT
TRACTIP FLEXIVA PULS ID 200XHI (Laser) IMPLANT
TRACTIP FLEXIVA PULSE ID 200 (Laser)
TRAY FOLEY MTR SLVR 16FR STAT (SET/KITS/TRAYS/PACK) ×1 IMPLANT
TUBING CONNECTING 10 (TUBING) ×1 IMPLANT
TUBING STONE CATCHER TRILOGY (MISCELLANEOUS) IMPLANT
TUBING UROLOGY SET (TUBING) ×1 IMPLANT
WATER STERILE IRR 1000ML POUR (IV SOLUTION) ×1 IMPLANT

## 2023-08-01 NOTE — H&P (Signed)
H&P  Chief Complaint: Right renal calculus  History of Present Illness: 57 year old female with a large right renal pelvic junction calculus presents for right PCNL.  Past Medical History:  Diagnosis Date   Anemia    Anxiety    Arthritis    History of kidney stones    Low back pain with sciatica    Lumbar herniated disc    L5-S1   History reviewed. No pertinent surgical history.  Home Medications:  Medications Prior to Admission  Medication Sig Dispense Refill Last Dose   ALPRAZolam (XANAX) 0.5 MG tablet Take 0.25 mg by mouth at bedtime.   07/31/2023   naproxen sodium (ALEVE) 220 MG tablet Take 440 mg by mouth every 6 (six) hours as needed (pain).   Past Week   nitrofurantoin, macrocrystal-monohydrate, (MACROBID) 100 MG capsule Take 100 mg by mouth at bedtime.      Allergies:  Allergies  Allergen Reactions   Cymbalta [Duloxetine Hcl]     Headache  Blurred vision   Lexapro [Escitalopram] Nausea Only    Adverse effect   Bactrim [Sulfamethoxazole-Trimethoprim] Rash    History reviewed. No pertinent family history. Social History:  reports that she has never smoked. She has never been exposed to tobacco smoke. She has never used smokeless tobacco. She reports current alcohol use. She reports that she does not use drugs.  ROS: A complete review of systems was performed.  All systems are negative except for pertinent findings as noted. ROS   Physical Exam:  Vital signs in last 24 hours: Temp:  [98.1 F (36.7 C)-98.2 F (36.8 C)] 98.1 F (36.7 C) (09/09 0956) Pulse Rate:  [70-87] 77 (09/09 1113) Resp:  [9-18] 18 (09/09 1111) BP: (126-155)/(70-87) 142/77 (09/09 1111) SpO2:  [97 %-100 %] 99 % (09/09 1113) Weight:  [59.4 kg] 59.4 kg (09/09 0849) General:  Alert and oriented, No acute distress HEENT: Normocephalic, atraumatic Neck: No JVD or lymphadenopathy Cardiovascular: Regular rate and rhythm Lungs: Regular rate and effort Abdomen: Soft, nontender, nondistended, no  abdominal masses Back: No CVA tenderness Extremities: No edema Neurologic: Grossly intact  Laboratory Data:  Results for orders placed or performed during the hospital encounter of 08/01/23 (from the past 24 hour(s))  ABO/Rh     Status: None   Collection Time: 08/01/23  8:35 AM  Result Value Ref Range   ABO/RH(D)      O POS Performed at [facility], [address] [place], [place] [address]   CBC     Status: Abnormal   Collection Time: 08/01/23  8:35 AM  Result Value Ref Range   WBC 4.5 4.0 - 10.5 K/uL   RBC 3.83 (L) 3.87 - 5.11 MIL/uL   Hemoglobin 10.5 (L) 12.0 - 15.0 g/dL   HCT 60.4 (L) 54.0 - 98.1 %   MCV 85.6 80.0 - 100.0 fL   MCH 27.4 26.0 - 34.0 pg   MCHC 32.0 30.0 - 36.0 g/dL   RDW 19.1 47.8 - 29.5 %   Platelets 190 150 - 400 K/uL   nRBC 0.0 0.0 - 0.2 %  Protime-INR     Status: None   Collection Time: 08/01/23  8:35 AM  Result Value Ref Range   Prothrombin Time 13.2 11.4 - 15.2 seconds   INR 1.0 0.8 - 1.2   No results found for this or any previous visit (from the past 240 hour(s)). Creatinine: Recent Labs    07/29/23 1330  CREATININE 0.69    Impression/Assessment:  Right renal  calculus  Plan:  Proceed with right PCNL  Ray Church, III 08/01/2023, 12:52 PM

## 2023-08-01 NOTE — Anesthesia Procedure Notes (Signed)
Procedure Name: Intubation Date/Time: 08/01/2023 11:25 AM  Performed by: Donna Bernard, CRNAPre-anesthesia Checklist: Patient identified, Emergency Drugs available, Suction available, Patient being monitored and Timeout performed Patient Re-evaluated:Patient Re-evaluated prior to induction Oxygen Delivery Method: Circle system utilized Preoxygenation: Pre-oxygenation with 100% oxygen Induction Type: IV induction Ventilation: Mask ventilation without difficulty Laryngoscope Size: Miller and 3 Tube type: Oral Tube size: 7.0 mm Number of attempts: 1 Airway Equipment and Method: Stylet Placement Confirmation: positive ETCO2, ETT inserted through vocal cords under direct vision, CO2 detector and breath sounds checked- equal and bilateral Secured at: 22 cm Tube secured with: Tape Dental Injury: Teeth and Oropharynx as per pre-operative assessment

## 2023-08-01 NOTE — Anesthesia Postprocedure Evaluation (Signed)
Anesthesia Post Note  Patient: Marissa Ayers  Procedure(s) Performed: RIGHT NEPHROLITHOTOMY PERCUTANEOUS (Right)     Patient location during evaluation: PACU Anesthesia Type: General Level of consciousness: awake and alert, oriented and patient cooperative Pain management: pain level controlled Vital Signs Assessment: post-procedure vital signs reviewed and stable Respiratory status: spontaneous breathing, nonlabored ventilation and respiratory function stable Cardiovascular status: blood pressure returned to baseline and stable Postop Assessment: no apparent nausea or vomiting Anesthetic complications: no   No notable events documented.  Last Vitals:  Vitals:   08/01/23 1300 08/01/23 1315  BP: 131/72 133/73  Pulse: 78 72  Resp: 14 12  Temp: (!) 36.4 C   SpO2: 95% 94%    Last Pain:  Vitals:   08/01/23 1315  TempSrc:   PainSc: 7                  Lannie Fields

## 2023-08-01 NOTE — Transfer of Care (Signed)
Immediate Anesthesia Transfer of Care Note  Patient: Marissa Ayers  Procedure(s) Performed: RIGHT NEPHROLITHOTOMY PERCUTANEOUS (Right)  Patient Location: PACU  Anesthesia Type:General  Level of Consciousness: awake, oriented, drowsy, and patient cooperative  Airway & Oxygen Therapy: Patient Spontanous Breathing and Patient connected to face mask oxygen  Post-op Assessment: Report given to RN and Post -op Vital signs reviewed and stable  Post vital signs: stable  Last Vitals:  Vitals Value Taken Time  BP 159/78 08/01/23 1254  Temp    Pulse 83 08/01/23 1255  Resp 21 08/01/23 1255  SpO2 100 % 08/01/23 1255  Vitals shown include unfiled device data.  Last Pain:  Vitals:   08/01/23 0849  TempSrc:   PainSc: 5          Complications: No notable events documented.

## 2023-08-01 NOTE — Discharge Instructions (Signed)
Discharge instructions following PCNL ° °Call your doctor for: °Fevers greater than 100.5 °Severe nausea or vomiting °Increasing pain not controlled by pain medication °Increasing redness or drainage from incisions °Decreased urine output or a catheter is no longer draining ° °The number for questions is [phone]. ° °Activity: °Gradually increase activity with short frequent walks, 3-4 times a day.  Avoid strenuous activities, like sports, lawn-mowing, or heavy lifting (more than 10-15 pounds).  Wear loose, comfortable clothing that pull or kink the tube or tubes.  Do not drive while taking pain medication, or until your doctor permitts it. ° °Bathing and dressing changes: °You should not shower for 48 hours after surgery.  Do not soak your back in a bathtub. ° °Drainage bag care: °You may be discharged with a drainage bag around the site of your surgery.  The drainage bag should be secured such that it never pulls or loosens to prevent it from leaking.  It is important to wash her hands before and after emptying the drainage bag to help prevent the spread of infection.  The drainage bag should be emptied as needed.  When the wound stops draining or it is manageable with a dry gauze dressing, you can remove the bag. ° °If your tube in the back was removed, you should expect to have some leakage of fluid from the back incision.  This should slowly decrease and stop over the next couple of days.  If you have severe pain or persistent leakage, please call the number above.  Otherwise, your dressing can be changed 1-2 times daily or more if needed. ° °Diet: °It is extremely important to drink plenty of fluids after surgery, especially water.  You may resume your regular diet, unless otherwise instructed. ° °Medications: °May take Tylenol (acetaminophen) or ibuprofen (Advil, Motrin) as directed over-the-counter. °Take any prescriptions as directed. ° °Follow-up appointments: °Follow-up appointment will be scheduled  with Dr. Bell °

## 2023-08-01 NOTE — Procedures (Signed)
Interventional Radiology Procedure Note  Procedure: Right nephroureteral catheter placement prior to PCNL  Indication: Right nephrolithiasis  Findings: Please refer to procedural dictation for full description.  Complications: None  EBL: < 10 mL  Acquanetta Belling, MD [phone]

## 2023-08-01 NOTE — Anesthesia Preprocedure Evaluation (Signed)
Anesthesia Evaluation  Patient identified by MRN, date of birth, ID band Patient awake    Reviewed: Allergy & Precautions, H&P , NPO status , Patient's Chart, lab work & pertinent test results  Airway Mallampati: II  TM Distance: >3 FB Neck ROM: Full    Dental no notable dental hx.    Pulmonary neg pulmonary ROS   Pulmonary exam normal breath sounds clear to auscultation       Cardiovascular negative cardio ROS Normal cardiovascular exam Rhythm:Regular Rate:Normal     Neuro/Psych  PSYCHIATRIC DISORDERS Anxiety     negative neurological ROS     GI/Hepatic negative GI ROS, Neg liver ROS,,,  Endo/Other  negative endocrine ROS    Renal/GU Renal disease (R renal stone)  negative genitourinary   Musculoskeletal  (+) Arthritis , Osteoarthritis,    Abdominal   Peds negative pediatric ROS (+)  Hematology  (+) Blood dyscrasia, anemia Hb 11.1   Anesthesia Other Findings   Reproductive/Obstetrics negative OB ROS                             Anesthesia Physical Anesthesia Plan  ASA: 2  Anesthesia Plan: General   Post-op Pain Management: Tylenol PO (pre-op)*   Induction: Intravenous  PONV Risk Score and Plan: 3 and Ondansetron, Dexamethasone, Midazolam and Treatment may vary due to age or medical condition  Airway Management Planned: Oral ETT  Additional Equipment: None  Intra-op Plan:   Post-operative Plan: Extubation in OR  Informed Consent: I have reviewed the patients History and Physical, chart, labs and discussed the procedure including the risks, benefits and alternatives for the proposed anesthesia with the patient or authorized representative who has indicated his/her understanding and acceptance.     Dental advisory given  Plan Discussed with: CRNA  Anesthesia Plan Comments:        Anesthesia Quick Evaluation

## 2023-08-01 NOTE — Op Note (Signed)
Operative Note  Preoperative diagnosis:  1.  Right renal calculus  Postoperative diagnosis: 1.  Right renal calculus   Procedure(s): 1.  Right percutaneous nephrolithotomy  Surgeon: Modena Slater, MD  Assistants: None  Anesthesia: General  Complications: None immediate  EBL: 100 cc  Specimens: 1.  Renal calculus  Drains/Catheters: 1.  6 x 26 double-J ureteral stent  Intraoperative findings: Large right renal calculus  Indication: 57 year old female with a large right renal calculus presents for the previously mentioned operation.  Description of procedure:  The patient was identified and consent was obtained.  The patient was taken to the operating room and placed in the supine position.  The patient was placed under general anesthesia.  Perioperative antibiotics were administered.  The patient was placed in prone position and all pressure points were padded.  Patient was prepped and draped in a standard sterile fashion and a timeout was performed.  A Super Stiff wire was advanced through the nephroureteral stent down to the bladder under fluoroscopic guidance and the nephroureteral stent was removed.  A dual-lumen ureteral catheter was advanced over the Super Stiff wire into the renal pelvis and an antegrade nephrostogram was performed.  This showed a well opacified kidney and a filling defect corresponding to the stone of interest.  I advanced the dual-lumen ureteral catheter into the proximal ureter under fluoroscopic guidance followed by placement of a second Super Stiff wire down to the bladder under fluoroscopic guidance.  The dual-lumen catheter was removed.  An incision was made alongside the wires.  The balloon dilator was then advanced over one of the wires and into the renal pelvis fluoroscopic guidance and the tract was dilated to a pressure of 18.  The sheath was advanced over the balloon and into the renal pelvis.  The balloon was withdrawn keeping the sheath in place.   The nephroscope was advanced into the kidney and the stone of interest was encountered.  The stone was then removed with a combination of pneumatic and ultrasound with suction. All stone was removed and there was no evidence of any other stones within the kidney.  The nephroscope along was withdrawn.    A 6 x 26 double-J ureteral stent was advanced over 1 of the wires under fluoroscopic guidance and the wire was withdrawn.  Fluoroscopy confirmed a good coil within the bladder as well as a good coil in the renal pelvis proximally.  The other wire was withdrawn.  I withdrew the sheath.  I closed the incision with 3-0 Monocryl.  This concluded the operation.  The patient tolerated procedure well and was stable postoperatively.  Plan: Patient will remain under observation overnight. Stent to be removed in 1 week.

## 2023-08-02 ENCOUNTER — Encounter (HOSPITAL_COMMUNITY): Payer: Self-pay | Admitting: Urology

## 2023-08-02 DIAGNOSIS — N201 Calculus of ureter: Secondary | ICD-10-CM | POA: Diagnosis present

## 2023-08-02 DIAGNOSIS — Z888 Allergy status to other drugs, medicaments and biological substances status: Secondary | ICD-10-CM | POA: Diagnosis not present

## 2023-08-02 DIAGNOSIS — D62 Acute posthemorrhagic anemia: Secondary | ICD-10-CM | POA: Diagnosis not present

## 2023-08-02 DIAGNOSIS — Z79899 Other long term (current) drug therapy: Secondary | ICD-10-CM | POA: Diagnosis not present

## 2023-08-02 DIAGNOSIS — F419 Anxiety disorder, unspecified: Secondary | ICD-10-CM | POA: Diagnosis present

## 2023-08-02 DIAGNOSIS — N132 Hydronephrosis with renal and ureteral calculous obstruction: Secondary | ICD-10-CM | POA: Diagnosis present

## 2023-08-02 LAB — BASIC METABOLIC PANEL
Anion gap: 10 (ref 5–15)
BUN: 19 mg/dL (ref 6–20)
CO2: 22 mmol/L (ref 22–32)
Calcium: 8.2 mg/dL — ABNORMAL LOW (ref 8.9–10.3)
Chloride: 105 mmol/L (ref 98–111)
Creatinine, Ser: 0.84 mg/dL (ref 0.44–1.00)
GFR, Estimated: >60 mL/min (ref >60)
Glucose, Bld: 143 mg/dL — ABNORMAL HIGH (ref 70–99)
Potassium: 3.9 mmol/L (ref 3.5–5.1)
Sodium: 137 mmol/L (ref 135–145)

## 2023-08-02 LAB — HEMOGLOBIN AND HEMATOCRIT, BLOOD
HCT: 23.2 % — ABNORMAL LOW (ref 36.0–46.0)
HCT: 22.6 % — ABNORMAL LOW (ref 36.0–46.0)
Hemoglobin: 7.3 g/dL — ABNORMAL LOW (ref 12.0–15.0)
Hemoglobin: 7 g/dL — ABNORMAL LOW (ref 12.0–15.0)

## 2023-08-02 LAB — PREPARE RBC (CROSSMATCH)

## 2023-08-02 MED ORDER — OXYCODONE HCL 5 MG PO TABS: 5.0000 mg | ORAL_TABLET | ORAL | 0 refills | Status: AC | PRN

## 2023-08-02 MED ORDER — SODIUM CHLORIDE 0.9% IV SOLUTION: Freq: Once | INTRAVENOUS | Status: AC

## 2023-08-02 NOTE — TOC CM/SW Note (Signed)
Transition of Care [facility]) - Inpatient Brief Assessment   Patient Details  Name: Marissa Ayers MRN: [id number] Date of Birth: May 10, 1966  Transition of Care [facility]) CM/SW Contact:    Howell Rucks, RN Phone Number: 08/02/2023, 11:09 AM   Clinical Narrative: Met with pt at bedside to introduce role of TOC/NCM and review for dc planning. Pt reports she has a PCP and pharmacy in place, no current home care services or home DME. Pt reports she feels safe returning home with support from her spouse, confirmed spouse will provide transportation at discharge.TOC Brief Assessment completed. No TOC needs identified.     Transition of Care Asessment: Insurance and Status: Insurance coverage has been reviewed Patient has primary care physician: Yes Home environment has been reviewed: private residence with spouse Prior level of function:: Independent Prior/Current Home Services: No current home services Social Determinants of Health Reivew: SDOH reviewed no interventions necessary Readmission risk has been reviewed: Yes Transition of care needs: no transition of care needs at this time

## 2023-08-02 NOTE — Plan of Care (Signed)
Repeat HgB returned at 7.0, Discussed transfusion protocol with pt. In consideration of her self reported ongoing hematuria, shared decision was made to proceed with transfusion of 1u PRBC. Nursing to save string of bottles. Repeat CBC tomorrow am.

## 2023-08-02 NOTE — Progress Notes (Signed)
Urology Inpatient Progress Report  Ureteral calculi [N20.1]  Procedure(s): RIGHT NEPHROLITHOTOMY PERCUTANEOUS  1 Day Post-Op   Intv/Subj: No acute events overnight. Hgb trending down and was 7.3 this morning. HR and BP normal. Having some flank pain   Principal Problem:   Ureteral calculi  Current Facility-Administered Medications  Medication Dose Route Frequency Provider Last Rate Last Admin   0.9 %  sodium chloride infusion   Intravenous Continuous Ray Church III, MD 50 mL/hr at 08/01/23 1619 New Bag at 08/01/23 1619   acetaminophen (TYLENOL) tablet 650 mg  650 mg Oral Q4H PRN Crista Elliot, MD       ALPRAZolam Prudy Feeler) tablet 0.25 mg  0.25 mg Oral QHS Ray Church III, MD   0.25 mg at 08/01/23 2129   diphenhydrAMINE (BENADRYL) injection 12.5-25 mg  12.5-25 mg Intravenous Q6H PRN Ray Church III, MD       Or   diphenhydrAMINE (BENADRYL) 12.5 MG/5ML elixir 12.5-25 mg  12.5-25 mg Oral Q6H PRN Ray Church III, MD       docusate sodium (COLACE) capsule 100 mg  100 mg Oral BID Ray Church III, MD   100 mg at 08/02/23 4010   morphine (PF) 2 MG/ML injection 2-4 mg  2-4 mg Intravenous Q2H PRN Ray Church III, MD   4 mg at 08/02/23 0406   neomycin-bacitracin-polymyxin [phone] OINT 1 Application  1 Application Topical TID PRN Ray Church III, MD       ondansetron [facility]) injection 4 mg  4 mg Intravenous Q4H PRN Ray Church III, MD       oxybutynin (DITROPAN) tablet 5 mg  5 mg Oral Q8H PRN Ray Church III, MD       oxyCODONE (Oxy IR/ROXICODONE) immediate release tablet 5 mg  5 mg Oral Q4H PRN Ray Church III, MD   5 mg at 08/02/23 2725   senna (SENOKOT) tablet 8.6 mg  1 tablet Oral BID Ray Church III, MD   8.6 mg at 08/02/23 0812   zolpidem (AMBIEN) tablet 5 mg  5 mg Oral QHS PRN Ray Church III, MD         Objective: Vital: Vitals:   08/01/23 1415 08/01/23 1430 08/01/23 1445 08/01/23 1500  BP: 126/74 131/75 129/73 122/69  Pulse: 66 67 69  70  Resp: 15 13 11 10   Temp:    (!) 97.5 F (36.4 C)  TempSrc:      SpO2: 95% 98% 94% 96%  Weight:      Height:       I/Os: I/O last 3 completed shifts: In: 550 [I.V.:550] Out: 1025 [Urine:1000; Blood:25]  Physical Exam:  General: Patient is in no apparent distress Lungs: Normal respiratory effort, chest expands symmetrically. GI:  The abdomen is soft and nontender without mass. Flank incision c/d/I, no flank hematoma Ext: lower extremities symmetric  Lab Results: Recent Labs    08/01/23 0835 08/01/23 1636 08/02/23 0410  WBC 4.5  --   --   HGB 10.5* 8.8* 7.3*  HCT 32.8* 28.0* 23.2*   Recent Labs    08/01/23 1636 08/02/23 0410  NA 140 137  K 3.3* 3.9  CL 108 105  CO2 24 22  GLUCOSE 140* 143*  BUN 11 19  CREATININE 0.55 0.84  CALCIUM 7.6* 8.2*   Recent Labs    08/01/23 0835  INR 1.0   No results for input(s): "LABURIN" in the last 72 hours. Results  for orders placed or performed during the hospital encounter of 10/09/21  Resp Panel by RT-PCR (Flu A&B, Covid) Nasopharyngeal Swab     Status: None   Collection Time: 10/09/21  4:28 PM   Specimen: Nasopharyngeal Swab; Nasopharyngeal(NP) swabs in vial transport medium  Result Value Ref Range Status   SARS Coronavirus 2 by RT PCR NEGATIVE NEGATIVE Final    Comment: (NOTE) SARS-CoV-2 target nucleic acids are NOT DETECTED.  The SARS-CoV-2 RNA is generally detectable in upper respiratory specimens during the acute phase of infection. The lowest concentration of SARS-CoV-2 viral copies this assay can detect is 138 copies/mL. A negative result does not preclude SARS-Cov-2 infection and should not be used as the sole basis for treatment or other patient management decisions. A negative result may occur with  improper specimen collection/handling, submission of specimen other than nasopharyngeal swab, presence of viral mutation(s) within the areas targeted by this assay, and inadequate number of viral copies(<138  copies/mL). A negative result must be combined with clinical observations, patient history, and epidemiological information. The expected result is Negative.  Fact Sheet for Patients:  BloggerCourse.com  Fact Sheet for Healthcare Providers:  SeriousBroker.it  This test is no t yet approved or cleared by the [place] FDA and  has been authorized for detection and/or diagnosis of SARS-CoV-2 by FDA under an Emergency Use Authorization (EUA). This EUA will remain  in effect (meaning this test can be used) for the duration of the COVID-19 declaration under Section 564(b)(1) of the Act, 21 U.S.C.section 360bbb-3(b)(1), unless the authorization is terminated  or revoked sooner.       Influenza A by PCR NEGATIVE NEGATIVE Final   Influenza B by PCR NEGATIVE NEGATIVE Final    Comment: (NOTE) The Xpert Xpress SARS-CoV-2/FLU/RSV plus assay is intended as an aid in the diagnosis of influenza from Nasopharyngeal swab specimens and should not be used as a sole basis for treatment. Nasal washings and aspirates are unacceptable for Xpert Xpress SARS-CoV-2/FLU/RSV testing.  Fact Sheet for Patients: BloggerCourse.com  Fact Sheet for Healthcare Providers: SeriousBroker.it  This test is not yet approved or cleared by the [place] FDA and has been authorized for detection and/or diagnosis of SARS-CoV-2 by FDA under an Emergency Use Authorization (EUA). This EUA will remain in effect (meaning this test can be used) for the duration of the COVID-19 declaration under Section 564(b)(1) of the Act, 21 U.S.C. section 360bbb-3(b)(1), unless the authorization is terminated or revoked.  Performed at [facility], [address], [place], [place] [address]   Urine Culture     Status: Abnormal   Collection Time: 10/10/21  5:14 AM   Specimen: Urine, Clean Catch  Result Value Ref Range  Status   Specimen Description URINE, CLEAN CATCH  Final   Special Requests   Final    NONE Performed at [facility], [address], [place], [place] [address]    Culture MULTIPLE SPECIES PRESENT, SUGGEST RECOLLECTION (A)  Final   Report Status 10/11/2021 FINAL  Final    Studies/Results: IR URETERAL STENT RIGHT NEW ACCESS W/O SEP NEPHROSTOMY CATH  Result Date: 08/01/2023 INDICATION: 57 year old woman with right nephrolithiasis presents to IR for right nephroureteral catheter placement prior to PCNL EXAM: Right nephroureteral catheter placement with fluoroscopic and ultrasound guidance COMPARISON:  None available MEDICATIONS: Rocephin 2 g IV; The antibiotic was administered in an appropriate time frame prior to skin puncture. ANESTHESIA/SEDATION: Moderate (conscious) sedation was employed during this procedure. A total of Versed  2 mg and Fentanyl 100 mcg was administered intravenously by the radiology nurse. Total intra-service moderate Sedation Time: 10 minutes. The patient's level of consciousness and vital signs were monitored continuously by radiology nursing throughout the procedure under my direct supervision. CONTRAST:  10 mL of Omnipaque 300-administered into the collecting system(s) FLUOROSCOPY: Radiation Exposure Index (as provided by the fluoroscopic device): 6 mGy Kerma COMPLICATIONS: None immediate. PROCEDURE: Informed written consent was obtained from the patient after a thorough discussion of the procedural risks, benefits and alternatives. All questions were addressed. Maximal Sterile Barrier Technique was utilized including caps, mask, sterile gowns, sterile gloves, sterile drape, hand hygiene and skin antiseptic. A timeout was performed prior to the initiation of the procedure. Patient position prone on the procedure table. The right flank was prepped and draped in the usual sterile fashion. Following local administration, lower pole calyx was accessed with a 21 gauge needle  utilizing continuous ultrasound guidance. 21 gauge needle exchanged for transitional dilator set over 0.018 inch guidewire. Contrast administered through the 5 [place] dilator confirmed appropriate access of the renal collecting system. 5 [place] dilator removed over 0.035 inch glidewire. Glidewire and Kumpe catheter navigated beyond the renal pelvis stone to the level of the bladder utilizing fluoroscopic guidance. Contrast administered through the Kumpe catheter confirmed appropriate access of the bladder lumen. The catheter was secured to skin with sterile dressing and tape. IMPRESSION: Successful insertion of right nephroureteral catheter. Electronically Signed   By: Acquanetta Belling M.D.   On: 08/01/2023 16:43   DG C-Arm 1-60 Min-No Report  Result Date: 08/01/2023 Fluoroscopy was utilized by the requesting physician.  No radiographic interpretation.    Assessment: Right renal calculus Acute blood loss anemia  Procedure(s): RIGHT NEPHROLITHOTOMY PERCUTANEOUS, 1 Day Post-Op  doing well.  Plan: Check hgb at 12pm. If further decrease we will transfuse. May keep another day to trend depending on 12pm check   Modena Slater, MD Urology 08/02/2023, 11:43 AM

## 2023-08-03 LAB — BPAM RBC
Blood Product Expiration Date: 202410112359
ISSUE DATE / TIME: 202409101642
Unit Type and Rh: 5100

## 2023-08-03 LAB — CBC
HCT: 24.1 % — ABNORMAL LOW (ref 36.0–46.0)
Hemoglobin: 7.9 g/dL — ABNORMAL LOW (ref 12.0–15.0)
MCH: 28.6 pg (ref 26.0–34.0)
MCHC: 32.8 g/dL (ref 30.0–36.0)
MCV: 87.3 fL (ref 80.0–100.0)
Platelets: 125 10*3/uL — ABNORMAL LOW (ref 150–400)
RBC: 2.76 MIL/uL — ABNORMAL LOW (ref 3.87–5.11)
RDW: 14.5 % (ref 11.5–15.5)
WBC: 6.2 10*3/uL (ref 4.0–10.5)
nRBC: 0 % (ref 0.0–0.2)

## 2023-08-03 LAB — TYPE AND SCREEN
ABO/RH(D): O POS
Antibody Screen: NEGATIVE
Unit division: 0

## 2023-08-03 LAB — IRON AND TIBC
Iron: 37 ug/dL (ref 28–170)
Saturation Ratios: 15 % (ref 10.4–31.8)
TIBC: 252 ug/dL (ref 250–450)
UIBC: 215 ug/dL

## 2023-08-03 LAB — HEMOGLOBIN AND HEMATOCRIT, BLOOD
HCT: 25.8 % — ABNORMAL LOW (ref 36.0–46.0)
Hemoglobin: 8.3 g/dL — ABNORMAL LOW (ref 12.0–15.0)

## 2023-08-03 NOTE — Discharge Summary (Addendum)
Patient ID: Marissa Ayers MRN: [id number] DOB/AGE: 03-06-1966 57 y.o.  Admit date: 08/01/2023 Discharge date: 08/03/2023  Primary Care Physician:  Ollen Bowl, MD  Discharge Diagnoses:   Present on Admission:  Ureteral calculi   Consults: None   Discharge Medications: Allergies as of 08/03/2023       Reactions   Cymbalta [duloxetine Hcl]    Headache  Blurred vision   Lexapro [escitalopram] Nausea Only   Adverse effect   Bactrim [sulfamethoxazole-trimethoprim] Rash        Medication List     TAKE these medications    ALPRAZolam 0.5 MG tablet Commonly known as: XANAX Take 0.25 mg by mouth at bedtime.   HYDROcodone-acetaminophen 5-325 MG tablet Commonly known as: Norco Take 1 tablet by mouth every 4 (four) hours as needed for moderate pain.   naproxen sodium 220 MG tablet Commonly known as: ALEVE Take 440 mg by mouth every 6 (six) hours as needed (pain).   nitrofurantoin (macrocrystal-monohydrate) 100 MG capsule Commonly known as: MACROBID Take 100 mg by mouth at bedtime.   oxyCODONE 5 MG immediate release tablet Commonly known as: Oxy IR/ROXICODONE Take 1 tablet (5 mg total) by mouth every 4 (four) hours as needed for severe pain.   tamsulosin 0.4 MG Caps capsule Commonly known as: FLOMAX Take 1 capsule (0.4 mg total) by mouth daily.         Significant Diagnostic Studies:  IR URETERAL STENT RIGHT NEW ACCESS W/O SEP NEPHROSTOMY CATH  Result Date: 08/01/2023 INDICATION: 57 year old woman with right nephrolithiasis presents to IR for right nephroureteral catheter placement prior to PCNL EXAM: Right nephroureteral catheter placement with fluoroscopic and ultrasound guidance COMPARISON:  None available MEDICATIONS: Rocephin 2 g IV; The antibiotic was administered in an appropriate time frame prior to skin puncture. ANESTHESIA/SEDATION: Moderate (conscious) sedation was employed during this procedure. A total of Versed 2 mg and Fentanyl 100 mcg was  administered intravenously by the radiology nurse. Total intra-service moderate Sedation Time: 10 minutes. The patient's level of consciousness and vital signs were monitored continuously by radiology nursing throughout the procedure under my direct supervision. CONTRAST:  10 mL of Omnipaque 300-administered into the collecting system(s) FLUOROSCOPY: Radiation Exposure Index (as provided by the fluoroscopic device): 6 mGy Kerma COMPLICATIONS: None immediate. PROCEDURE: Informed written consent was obtained from the patient after a thorough discussion of the procedural risks, benefits and alternatives. All questions were addressed. Maximal Sterile Barrier Technique was utilized including caps, mask, sterile gowns, sterile gloves, sterile drape, hand hygiene and skin antiseptic. A timeout was performed prior to the initiation of the procedure. Patient position prone on the procedure table. The right flank was prepped and draped in the usual sterile fashion. Following local administration, lower pole calyx was accessed with a 21 gauge needle utilizing continuous ultrasound guidance. 21 gauge needle exchanged for transitional dilator set over 0.018 inch guidewire. Contrast administered through the 5 [place] dilator confirmed appropriate access of the renal collecting system. 5 [place] dilator removed over 0.035 inch glidewire. Glidewire and Kumpe catheter navigated beyond the renal pelvis stone to the level of the bladder utilizing fluoroscopic guidance. Contrast administered through the Kumpe catheter confirmed appropriate access of the bladder lumen. The catheter was secured to skin with sterile dressing and tape. IMPRESSION: Successful insertion of right nephroureteral catheter. Electronically Signed   By: Acquanetta Belling M.D.   On: 08/01/2023 16:43   DG C-Arm 1-60 Min-No Report  Result Date: 08/01/2023 Fluoroscopy was utilized by the requesting physician.  No  radiographic interpretation.    Brief H and P: For  complete details please refer to admission H&P, but in brief, Marissa Ayers is a 57 year old female presenting to [facility] for scheduled right side percutaneous nephrolithotomy with Dr. Alvester Morin on 08/01/23.  Her procedure was completed without complication and she was admitted to the floor for labs, monitoring, and recovery.  As her hemoglobin was quite low preoperatively, an expected amount of postoperative blood loss pushed her lab values low enough to justify transfusion.  She received 1U PRBC on 08/02/2023.  She remained in hospital overnight for repeat labs and monitoring.  She reports ongoing anemia and intermittent iron supplementation per her PCP.  Iron studies did not reflect iron deficiency anemia and I have recommended she follow-up with hematology.  Her hematuria has largely resolved as of this morning, her hemoglobin does not show any additional blood loss following her transfusion, and her hemoglobin continued to improve between morning labs and afternoon recheck.  She is ambulatory, tolerating a diet, and pain is well-managed.  Having hit all of her postoperative milestones she is appropriate for discharge home.  Hospital Course:  Principal Problem:   Ureteral calculi   Day of Discharge BP 121/65   Pulse 93   Temp 98.3 F (36.8 C) (Oral)   Resp 18   Ht 5\' 5"  (1.651 m)   Wt 59.4 kg   LMP 05/19/2015 Comment: bc pill  SpO2 98%   BMI 21.80 kg/m   Results for orders placed or performed during the hospital encounter of 08/01/23 (from the past 24 hour(s))  Prepare RBC (crossmatch)     Status: None   Collection Time: 08/02/23  1:54 PM  Result Value Ref Range   Order Confirmation      ORDER PROCESSED BY BLOOD BANK Performed at [facility], [address], [place], [place] [address]   CBC     Status: Abnormal   Collection Time: 08/03/23  4:33 AM  Result Value Ref Range   WBC 6.2 4.0 - 10.5 K/uL   RBC 2.76 (L) 3.87 - 5.11 MIL/uL   Hemoglobin 7.9 (L) 12.0 -  15.0 g/dL   HCT 74.2 (L) 59.5 - 63.8 %   MCV 87.3 80.0 - 100.0 fL   MCH 28.6 26.0 - 34.0 pg   MCHC 32.8 30.0 - 36.0 g/dL   RDW 75.6 43.3 - 29.5 %   Platelets 125 (L) 150 - 400 K/uL   nRBC 0.0 0.0 - 0.2 %  Iron and TIBC     Status: None   Collection Time: 08/03/23  4:33 AM  Result Value Ref Range   Iron 37 28 - 170 ug/dL   TIBC 188 416 - 606 ug/dL   Saturation Ratios 15 10.4 - 31.8 %   UIBC 215 ug/dL  Hemoglobin and hematocrit, blood     Status: Abnormal   Collection Time: 08/03/23 11:43 AM  Result Value Ref Range   Hemoglobin 8.3 (L) 12.0 - 15.0 g/dL   HCT 30.1 (L) 60.1 - 09.3 %    Physical Exam: General: Alert and awake oriented x3 not in any acute distress. HEENT: anicteric sclera, pupils reactive to light and accommodation CVS: S1-S2 clear no murmur rubs or gallops Chest: clear to auscultation bilaterally, no wheezing rales or rhonchi Abdomen: soft nontender, nondistended, normal bowel sounds, no organomegaly Extremities: no cyanosis, clubbing or edema noted bilaterally Neuro: Cranial nerves II-XII intact, no focal neurological deficits  Disposition: Patient will discharge home with her husband  by private vehicle.  Follow-up in clinic next week has already been scheduled.  Diet: As tolerated  Activity: Limit to 10 pounds until follow-up in office   TESTS THAT NEED FOLLOW-UP  Repeat CBC in clinic. Patient reports ongoing anemia without clear etiology.  Significant drop in hemoglobin compared to labs from 2 years ago.  Recommend assessment with hematology.  DISCHARGE FOLLOW-UP  Patient is already scheduled postoperative appointment in clinic.  Time spent on Discharge:  A total of 39 minutes was spent in planning and discharge.  Case and plan discussed with Dr. Alvester Morin, patient, and her husband.  SignedScherrie Bateman [place] 08/03/2023, 12:43 PM

## 2023-08-03 NOTE — Progress Notes (Signed)
   2 Days Post-Op Subjective: Patient resting comfortably in bed on rounds this morning.  No acute events overnight.  She reports that her hematuria has largely resolved, though she cannot be completely sure.  Objective: Vital signs in last 24 hours: Temp:  [98.3 F (36.8 C)-98.8 F (37.1 C)] 98.8 F (37.1 C) (09/11 0444) Pulse Rate:  [90-108] 90 (09/11 0444) Resp:  [15-20] 20 (09/11 0444) BP: (105-135)/(55-66) 118/66 (09/11 0444) SpO2:  [97 %-100 %] 98 % (09/11 0444)  Assessment/Plan: # Nephrolithiasis S/p right PCNL with Dr. Alvester Morin on 08/01/2023.  # Acute on chronic anemia Hgb of 14.0 in November 2022, 10.5 on arrival, down to 7.0 on 9/10.  Patient received 1 unit PRBC and is stable at 7.9 this morning. Patient reports that she was treated for anemia by her PCP without clear etiology for some time.  She has taken oral iron but struggled with nausea and constipation.  She does not take at this time. Iron studies collected 08/03/2023.  No evidence of iron deficiency anemia. Will forego supplementation at this time.  Recommend follow-up with PCP. String of bottles ordered last night but has not been collected.  They will collect this morning. Repeat H&H this afternoon. If hemoglobin remains stable will discharge home.    Intake/Output from previous day: 09/10 0701 - 09/11 0700 In: 645 [P.O.:240; Blood:405] Out: -   Intake/Output this shift: Total I/O In: -  Out: 250 [Urine:250]  Physical Exam:  General: Alert and oriented CV: No cyanosis Lungs: equal chest rise   Lab Results: Recent Labs    08/02/23 0410 08/02/23 1232 08/03/23 0433  HGB 7.3* 7.0* 7.9*  HCT 23.2* 22.6* 24.1*   BMET Recent Labs    08/01/23 1636 08/02/23 0410  NA 140 137  K 3.3* 3.9  CL 108 105  CO2 24 22  GLUCOSE 140* 143*  BUN 11 19  CREATININE 0.55 0.84  CALCIUM 7.6* 8.2*     Studies/Results: DG C-Arm 1-60 Min-No Report  Result Date: 08/01/2023 Fluoroscopy was utilized by the  requesting physician.  No radiographic interpretation.      LOS: 1 day   Elmon Kirschner, NP [facility] Pager: [phone]  08/03/2023, 9:53 AM

## 2023-08-03 NOTE — Plan of Care (Signed)

## 2023-08-03 NOTE — Plan of Care (Signed)
°  Problem: Nutrition: °Goal: Adequate nutrition will be maintained °Outcome: Progressing °  °Problem: Coping: °Goal: Level of anxiety will decrease °Outcome: Progressing °  °Problem: Safety: °Goal: Ability to remain free from injury will improve °Outcome: Progressing °  °
